# Patient Record
Sex: Male | Born: 1945 | Race: White | Hispanic: No | Marital: Single
Health system: Midwestern US, Community
[De-identification: ages and names within clinical notes are randomized; demographics above are authoritative.]

## PROBLEM LIST (undated history)

## (undated) MED ORDER — CIPROFLOXACIN 500 MG TAB
500 mg | ORAL_TABLET | Freq: Two times a day (BID) | ORAL | Status: AC
Start: ? — End: 2012-11-19

---

## 1999-12-07 ENCOUNTER — Encounter: Admission: RE | Admit: 1999-12-07 | Discharge: 1999-12-07 | Payer: Self-pay | Admitting: Family Medicine

## 1999-12-07 ENCOUNTER — Encounter: Payer: Self-pay | Admitting: Family Medicine

## 2003-10-25 ENCOUNTER — Encounter: Admission: RE | Admit: 2003-10-25 | Discharge: 2003-10-25 | Payer: Self-pay | Admitting: Family Medicine

## 2003-11-06 ENCOUNTER — Ambulatory Visit (HOSPITAL_COMMUNITY): Admission: RE | Admit: 2003-11-06 | Discharge: 2003-11-07 | Payer: Self-pay | Admitting: Neurosurgery

## 2004-01-14 ENCOUNTER — Encounter: Admission: RE | Admit: 2004-01-14 | Discharge: 2004-01-29 | Payer: Self-pay | Admitting: Neurosurgery

## 2006-09-18 ENCOUNTER — Emergency Department (HOSPITAL_COMMUNITY): Admission: EM | Admit: 2006-09-18 | Discharge: 2006-09-18 | Payer: Self-pay | Admitting: Emergency Medicine

## 2006-10-05 ENCOUNTER — Ambulatory Visit: Payer: Self-pay | Admitting: Psychiatry

## 2006-10-05 ENCOUNTER — Other Ambulatory Visit (HOSPITAL_COMMUNITY): Admission: RE | Admit: 2006-10-05 | Discharge: 2006-11-27 | Payer: Self-pay | Admitting: Psychiatry

## 2006-11-28 ENCOUNTER — Other Ambulatory Visit (HOSPITAL_COMMUNITY): Admission: RE | Admit: 2006-11-28 | Discharge: 2007-02-26 | Payer: Self-pay | Admitting: Psychiatry

## 2006-12-21 ENCOUNTER — Ambulatory Visit: Payer: Self-pay | Admitting: Psychiatry

## 2007-04-19 LAB — PSA SCREENING (SCREENING): Prostate Specific Ag: 3.2

## 2008-06-04 LAB — PSA SCREENING (SCREENING)

## 2008-07-25 LAB — PSA SCREENING (SCREENING): Prostate Specific Ag: 3.3

## 2008-10-27 IMAGING — CR DG CHEST 2V
2 series · 2 of 2 positions shown · non-contrast
Comparison: 11/05/03.

CLINICAL DATA: 60-year-old male, medical clearance.  History of alcoholism.  Anxiety and smoking.  
CHEST - 2 VIEW:

[w chest pa]
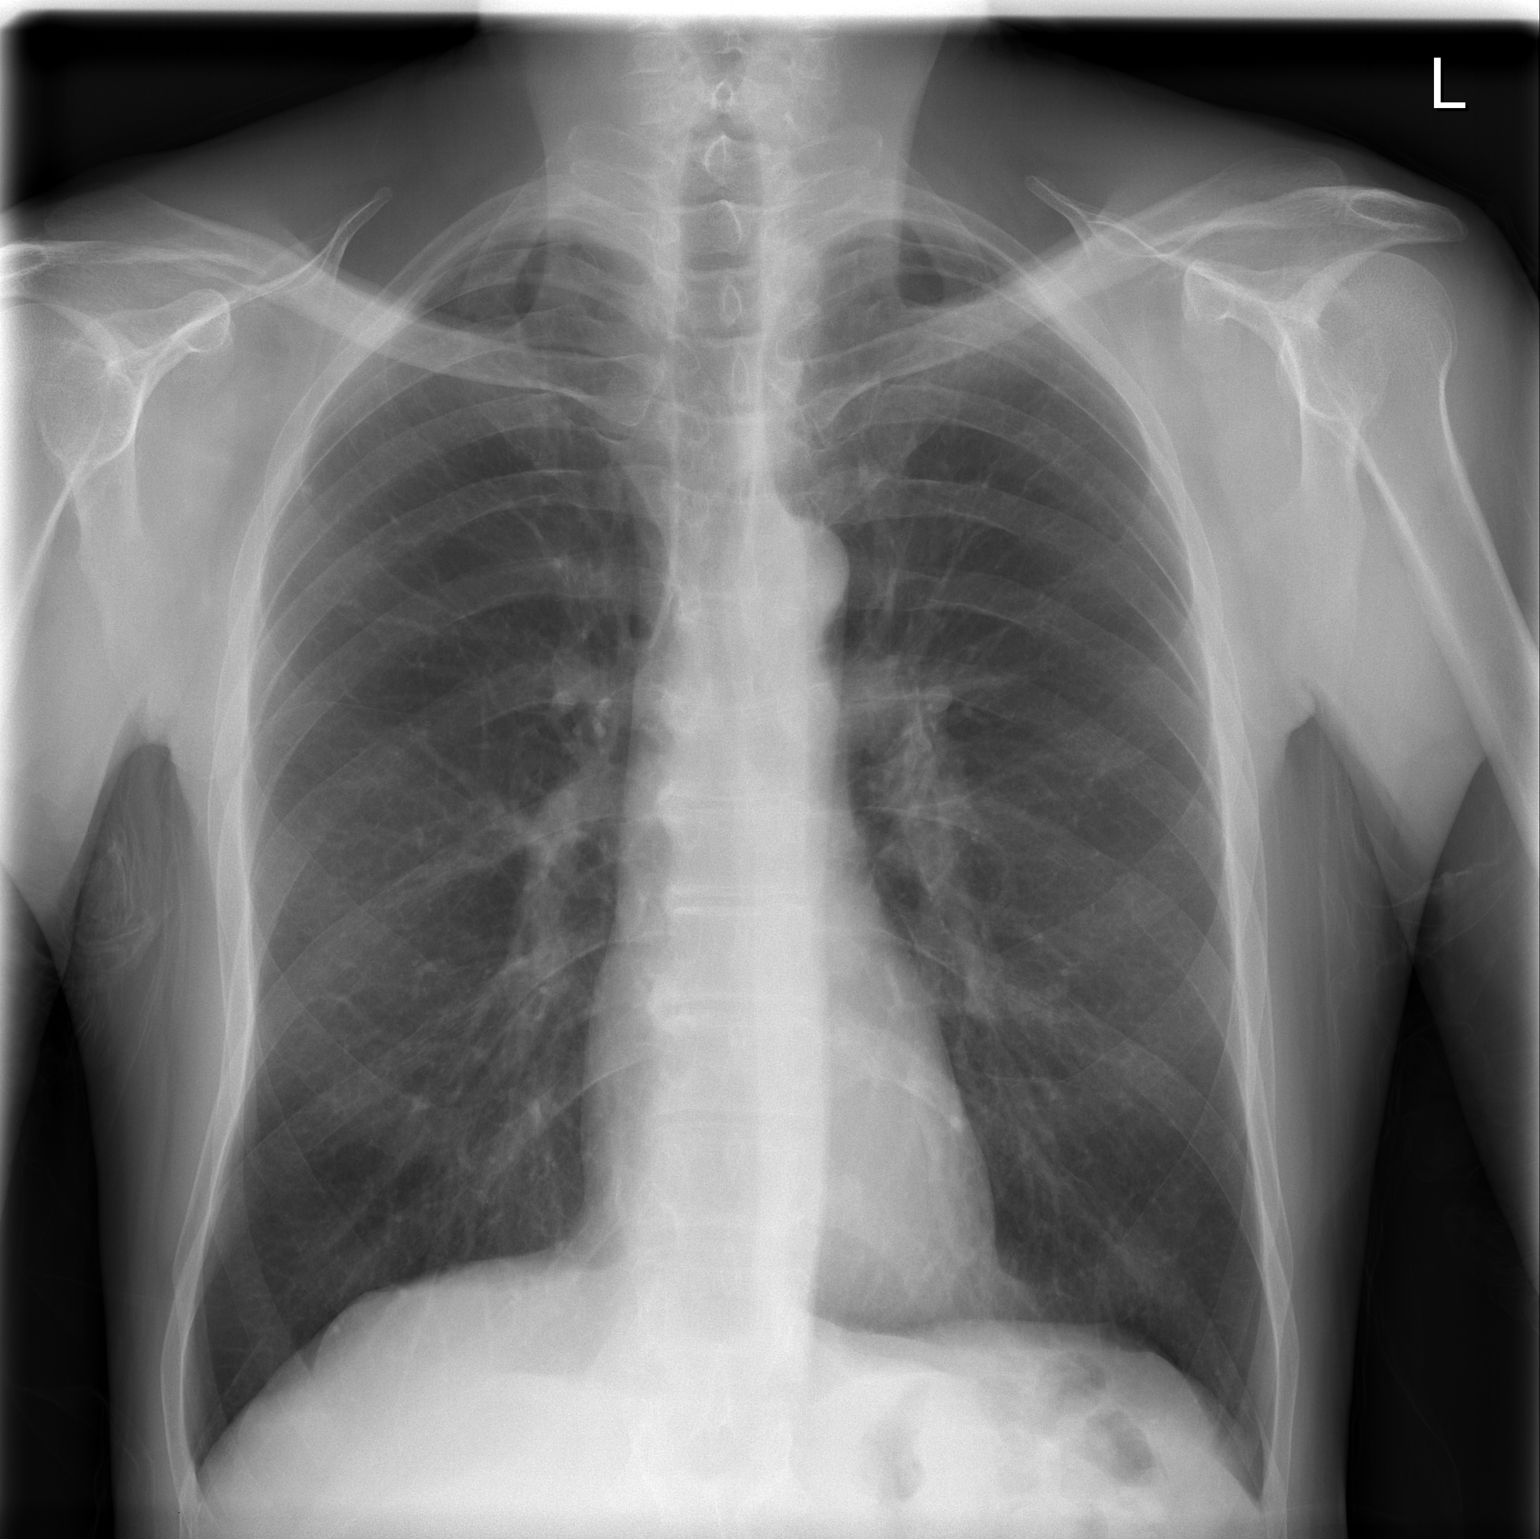

[w chest lat]
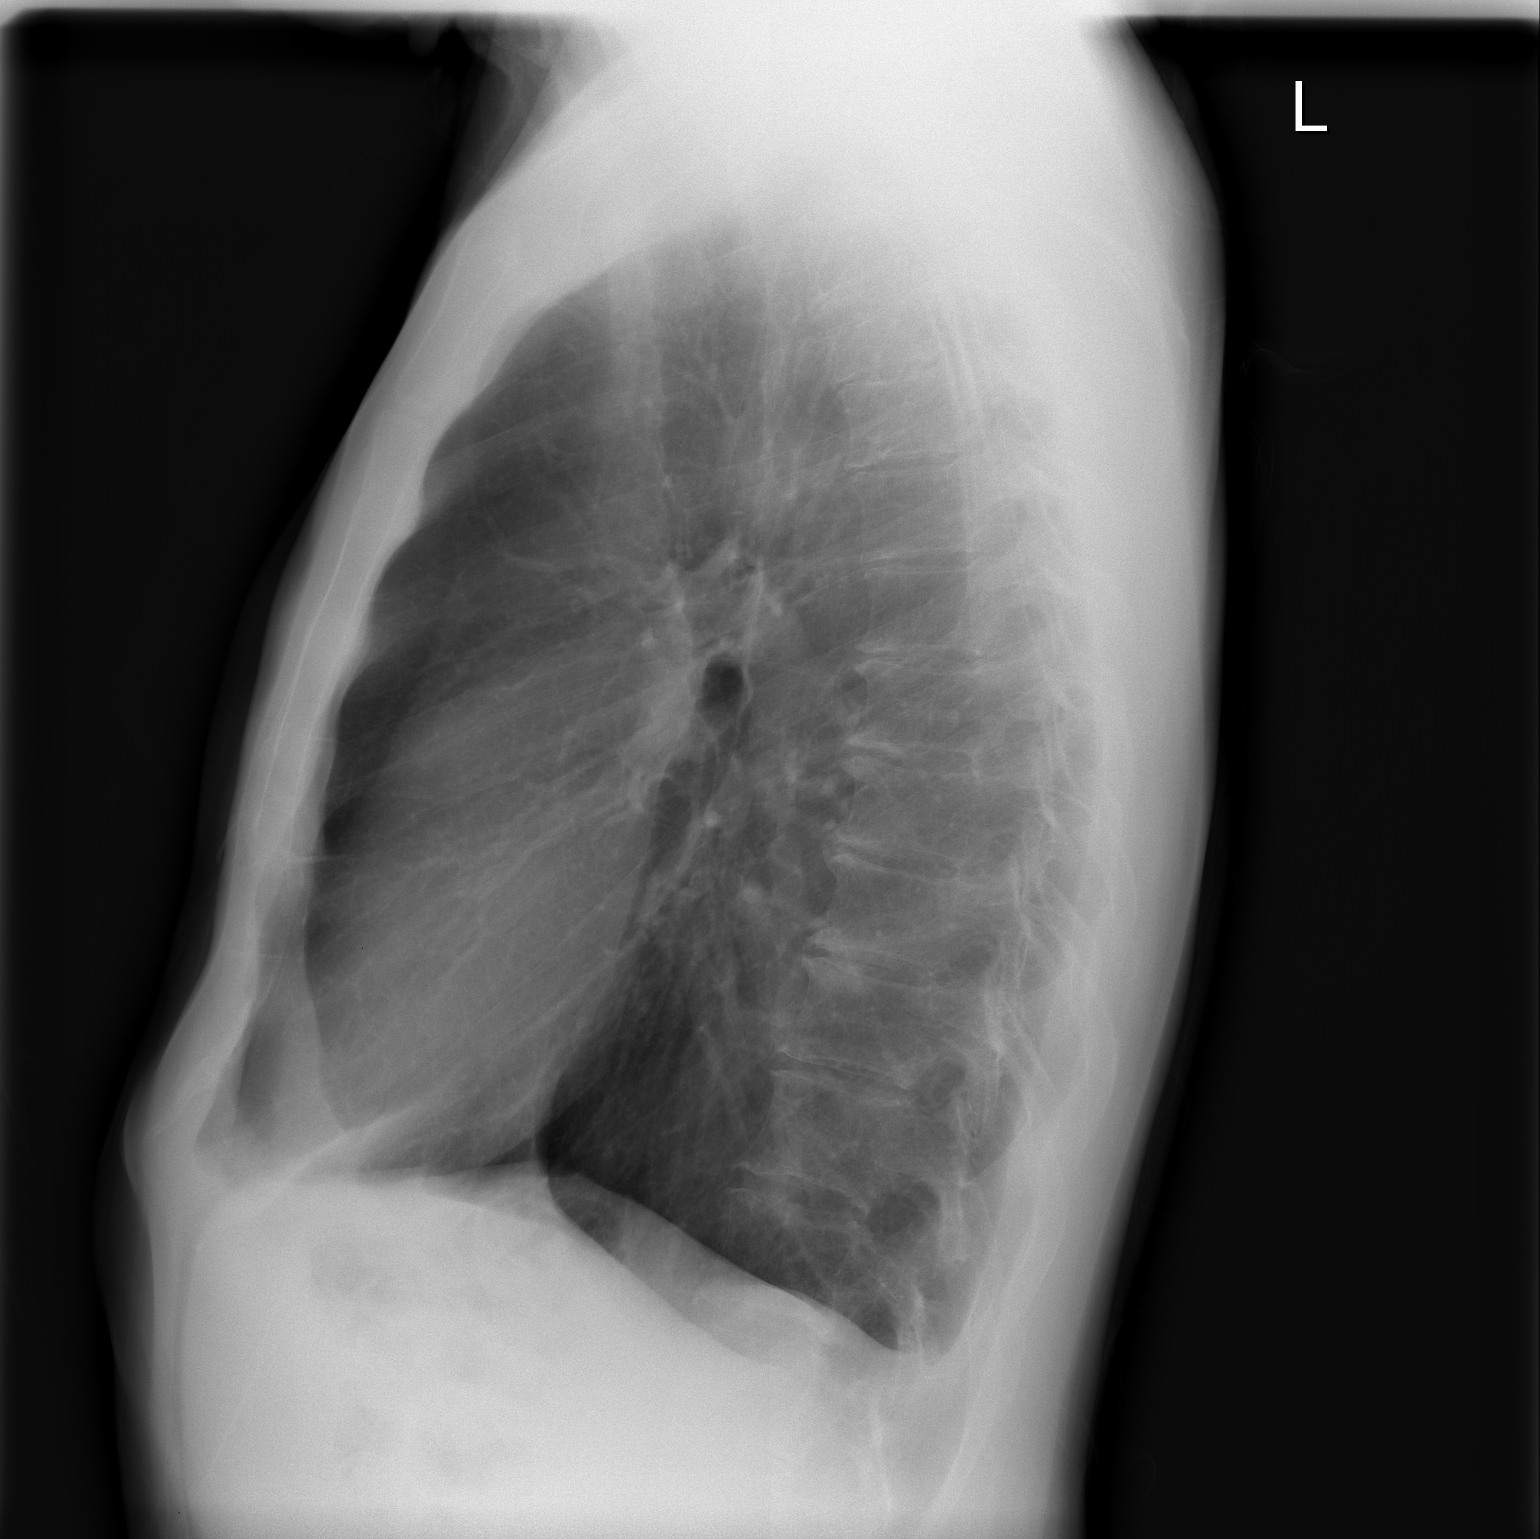

[2 of 2 positions shown; findings below may reference images not displayed]

FINDINGS: Mild hyperinflation is noted without acute air space process, edema, effusions, or pneumothorax.  Normal heart size and vascularity.  Degenerative changes of the spine.
IMPRESSION: Hyperinflation without acute air space process.  Stable exam.

## 2009-03-18 LAB — METABOLIC PANEL, COMPREHENSIVE
A-G Ratio: 0.8 (ref 0.8–1.7)
ALT (SGPT): 24 U/L — ABNORMAL LOW (ref 30–65)
AST (SGOT): 27 U/L (ref 15–37)
Albumin: 3.1 g/dL — ABNORMAL LOW (ref 3.4–5.0)
Alk. phosphatase: 76 U/L (ref 50–136)
Anion gap: 5 mmol/L (ref 5–15)
BUN/Creatinine ratio: 12 (ref 12–20)
BUN: 16 MG/DL (ref 7–18)
Bilirubin, total: 0.3 MG/DL (ref 0.1–0.9)
CO2: 29 MMOL/L (ref 21–32)
Calcium: 8.4 MG/DL (ref 8.4–10.4)
Chloride: 100 MMOL/L (ref 100–108)
Creatinine: 1.3 MG/DL (ref 0.6–1.3)
GFR est AA: >60 mL/min/{1.73_m2} (ref >60)
GFR est non-AA: 59 mL/min/{1.73_m2} — ABNORMAL LOW (ref >60)
Globulin: 3.8 g/dL (ref 2.0–4.0)
Glucose: 118 MG/DL — ABNORMAL HIGH (ref 74–99)
Potassium: 3.9 MMOL/L (ref 3.5–5.5)
Protein, total: 6.9 g/dL (ref 6.4–8.2)
Sodium: 134 MMOL/L — ABNORMAL LOW (ref 136–145)

## 2009-03-18 LAB — CKMB PROFILE
CK - MB: 1.8 ng/ml (ref 0.5–3.6)
CK - MB: 2.2 ng/ml (ref 0.5–3.6)
CK - MB: 1.3 ng/ml (ref 0.5–3.6)
CK-MB Index: 1.9 % (ref 0.0–4.0)
CK-MB Index: 1.5 % (ref 0.0–4.0)
CK-MB Index: 0.8 % (ref 0.0–4.0)
CK: 96 U/L (ref 35–232)
CK: 151 U/L (ref 35–232)
CK: 158 U/L (ref 35–232)

## 2009-03-18 LAB — CBC WITH AUTOMATED DIFF
ABS. BASOPHILS: 0 10*3/uL (ref 0.0–0.1)
ABS. EOSINOPHILS: 0 10*3/uL (ref 0.0–0.4)
ABS. LYMPHOCYTES: 0.5 10*3/uL — ABNORMAL LOW (ref 0.8–3.5)
ABS. MONOCYTES: 0.4 10*3/uL (ref 0–1.0)
ABS. NEUTROPHILS: 6.7 10*3/uL (ref 1.8–8.0)
BASOPHILS: 0 % (ref 0–3)
EOSINOPHILS: 0 % (ref 0–5)
HCT: 38.7 % (ref 37.0–49.0)
HGB: 12.7 g/dL — ABNORMAL LOW (ref 13.0–16.0)
LYMPHOCYTES: 7 % — ABNORMAL LOW (ref 20–51)
MCH: 30 PG (ref 25.0–35.0)
MCHC: 32.9 g/dL (ref 31.0–37.0)
MCV: 91.4 FL (ref 78.0–98.0)
MONOCYTES: 5 % (ref 2–9)
MPV: 8.6 FL (ref 7.4–10.4)
NEUTROPHILS: 88 % — ABNORMAL HIGH (ref 42–75)
PLATELET: 199 10*3/uL (ref 130–400)
RBC: 4.23 M/uL — ABNORMAL LOW (ref 4.50–5.30)
RDW: 18 % — ABNORMAL HIGH (ref 11.5–14.5)
WBC: 7.6 10*3/uL (ref 4.5–13.0)

## 2009-03-18 LAB — MAGNESIUM: Magnesium: 1.9 MG/DL (ref 1.8–2.4)

## 2009-03-18 LAB — NT-PRO BNP: NT pro-BNP: 3352 PG/ML — ABNORMAL HIGH (ref 0–900)

## 2009-03-18 LAB — TROPONIN I
Troponin-I, QT: <0.04 NG/ML (ref 0.00–0.08)
Troponin-I, QT: <0.04 NG/ML (ref 0.00–0.08)
Troponin-I, QT: <0.04 NG/ML (ref 0.00–0.08)

## 2009-03-18 LAB — DIGOXIN: Digoxin level: 1.38 NG/ML (ref 0.9–2.0)
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
## 2009-03-19 LAB — METABOLIC PANEL, BASIC
Anion gap: 5 mmol/L (ref 5–15)
BUN/Creatinine ratio: 12 (ref 12–20)
BUN: 15 MG/DL (ref 7–18)
CO2: 31 MMOL/L (ref 21–32)
Calcium: 8.1 MG/DL — ABNORMAL LOW (ref 8.4–10.4)
Chloride: 98 MMOL/L — ABNORMAL LOW (ref 100–108)
Creatinine: 1.3 MG/DL (ref 0.6–1.3)
GFR est AA: >60 mL/min/{1.73_m2} (ref >60)
GFR est non-AA: 59 mL/min/{1.73_m2} — ABNORMAL LOW (ref >60)
Glucose: 116 MG/DL — ABNORMAL HIGH (ref 74–99)
Potassium: 3.3 MMOL/L — ABNORMAL LOW (ref 3.5–5.5)
Sodium: 134 MMOL/L — ABNORMAL LOW (ref 136–145)

## 2009-03-19 LAB — CBC WITH AUTOMATED DIFF
ABS. BASOPHILS: 0 10*3/uL (ref 0.0–0.1)
ABS. EOSINOPHILS: 0.1 10*3/uL (ref 0.0–0.4)
ABS. LYMPHOCYTES: 0.6 10*3/uL — ABNORMAL LOW (ref 0.8–3.5)
ABS. MONOCYTES: 0.6 10*3/uL (ref 0–1.0)
ABS. NEUTROPHILS: 7.3 10*3/uL (ref 1.8–8.0)
BASOPHILS: 0 % (ref 0–3)
EOSINOPHILS: 1 % (ref 0–5)
HCT: 37.9 % (ref 37.0–49.0)
HGB: 12.6 g/dL — ABNORMAL LOW (ref 13.0–16.0)
LYMPHOCYTES: 7 % — ABNORMAL LOW (ref 20–51)
MCH: 30.6 PG (ref 25.0–35.0)
MCHC: 33.3 g/dL (ref 31.0–37.0)
MCV: 91.8 FL (ref 78.0–98.0)
MONOCYTES: 7 % (ref 2–9)
MPV: 8.5 FL (ref 7.4–10.4)
NEUTROPHILS: 85 % — ABNORMAL HIGH (ref 42–75)
PLATELET: 190 10*3/uL (ref 130–400)
RBC: 4.13 M/uL — ABNORMAL LOW (ref 4.50–5.30)
RDW: 18.9 % — ABNORMAL HIGH (ref 11.5–14.5)
WBC: 8.5 10*3/uL (ref 4.5–13.0)

## 2009-03-19 LAB — MAGNESIUM: Magnesium: 2.1 MG/DL (ref 1.8–2.4)

## 2009-03-19 NOTE — Discharge Summary (Signed)
[facility]   [address] [place], [place] [address]     TRANSFER SUMMARY    PATIENT: [name], [name]  MRN: [id number] ADMIT: 03/18/2009  BILLING: [id number] DSCH: 03/19/2009  ATTENDING: Edilia Bo, MD  DICTATING: Chauncey Reading, MD      ATTENDING PHYSICIAN: Edilia Bo, MD    PRIMARY CARE PHYSICIAN: Unknown.    CONSULTING PHYSICIANS: Harless Litten, MD, cardiology.    DISCHARGE DIAGNOSES  1. Syncope.  2. Congestive heart failure.  3. Morbid obesity with history of gastric bypass.  4. Atrial fibrillation, rate controlled.    BRIEF HISTORY: This is a 63 year old, Caucasian male with a history of an  arrhythmia, who presented to [facility] emergency department on 03/17/2009,  after having a syncopal episode at home. The patient states he had been in  normal state of health when he was standing up to get out of a van. He just  finished rolling up some carpet and thought that he was going to pass out  and then did pass out. He fell face forward and hit his face and broke a  rib. He has had numerous workups in the past for presyncope, but never a  full syncopal event. It is felt with the patient's presenting symptoms, he  should be admitted to hospital for further evaluation and treatment.    HOSPITAL COURSE: The patient was admitted to [facility] on  03/17/2009, for a syncopal event. The patient was admitted to the telemetry  floor for continuous cardiac monitoring. Cardiology consult was obtained,  and recommendations were made. With the patient's past medical history and  with recent syncopal events and numerous pre syncopal events, an AICD is  most likely warranted. The patient's cardiologist is located at [facility], therefore the patient is going to be transferred to [facility] today, 03/19/2009, for possible AICD placement.    PHYSICAL EXAMINATION AT THE TIME OF TRANSFER  VITAL SIGNS: The patient is afebrile with a temperature 97.1, blood   pressure 95/74 mmHg, pulse 71, respirations 20, saturating 93% on room  air.  NEUROLOGICALLY: He is awake, alert, oriented x3.  LUNGS: Clear to auscultation.  CARDIAC EXAM: Normal. Controlled atrial fibrillation rate of 70 to 80.  ABDOMEN: Soft, nontender, positive bowel sounds in all 4 quadrants.  Patient is morbidly obese.  EXTREMITIES: Warm, free of edema. All pulses are palpable.    DISCHARGE LABORATORY RESULTS: Revealed WBC of 8.5, platelets 190,000,  hemoglobin 12.6, hematocrit 37.9%. Sodium 135, potassium 3.3, chloride 98,  CO2 of 31, glucose 116, BUN 15, creatinine 1.1, calcium 8.1, magnesium  2.1.    TRANSFER INSTRUCTIONS  1. Activity: As tolerated.  2. Diet: Cardiac diet.  3. Lab work: CBC, CMP, magnesium levels in a.m.    FOLLOW UP: The patient should follow up with his cardiologist, Dr. Hyacinth Meeker,  upon arrival to [facility]. If you have any questions or  concerns, please do not hesitate to contact the [facility]  at [phone].    TRANSFER MEDICATIONS  1. Lipitor 40 mg p.o. at bedtime.  2. Coreg 6.25 mg p.o. daily.  3. Lasix 80 mg p.o. daily.  4. Vicodin 5/500 one tab p.o. at bedtime p.r.n. for pain.  5. Aspirin 325 mg p.o. daily.  6. Prednisone 2 mg p.o. daily.  7. The patient's digoxin, lisinopril and Trental are all on hold at this  time secondary to syncopal event and may be restarted at a later date per  cardiology recommendations.    TIME SPENT: Total transfer time is greater than 35 minutes.        Dictated By: Monica Becton, FNP             Electronically Signed   Chauncey Reading, MD 03/23/2009 12:52   Chauncey Reading, MD    MSC:WMX  D: 03/19/2009 11:43 A T:03/19/2009  12:19 P  CQDocID #: [id number] CScriptDoc #: [id number]  cc: Harless Litten, MD   Edilia Bo, MD   Chauncey Reading, MD    DR. MILLER

## 2010-06-11 LAB — PSA SCREENING (SCREENING)

## 2010-07-30 ENCOUNTER — Other Ambulatory Visit: Payer: Self-pay | Admitting: *Deleted

## 2010-10-15 NOTE — Op Note (Signed)
NAME:  Edward Contreras, LINCE NO.:  [id number]   MEDICAL RECORD NO.:  [id number]                   PATIENT TYPE:  OIB   LOCATION:  3013                                 FACILITY:  [facility]   PHYSICIAN:  Clydene Fake, M.D.               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   PROCEDURE:  Right L4-5 semihemilaminectomy and diskectomy, microdissection  with the microscope.   SURGEON:  Clydene Fake, M.D.   ASSISTANT:  Hilda Lias, M.D.   ANESTHESIA:  General endotracheal tube.   ESTIMATED BLOOD LOSS:  Minimal.   BLOOD REPLACED:  None.   DRAINS:  None.   COMPLICATIONS:  None.   INDICATIONS FOR PROCEDURE:  The patient is a 65 year old gentleman whose had  significant back pain over the last few years but in April of this year he  was moving lots of books at work and started having some severe back, right  hip and leg pain shooting down into his foot towards his big toe with some  tingling there. His leg pain has worsened, steroid Dosepak helped a little  bit but the pain started coming back at the end of that, denies any  weakness.  No left sided problems.  A MRI was done and showed right 4-5 disk  herniation.  Patient brought in for diskectomy.   DESCRIPTION OF PROCEDURE:  The patient was brought into the operating room,  general anesthesia induced, the patient was placed in prone position on a  Wilson frame with all pressure points padded. The patient was prepped and  draped in a sterile fashion, incision was injected with 10 mL of 1%  lidocaine with epinephrine. The needle was placed in the interspace, x-rays  obtained showing we were at the 4-5 interspace. An incision was then made  and centered over the 4-5 interspace.  Incision taken down into the fascia  and hemostasis was obtained with Bovie cauterization.  The fascia was  incised on the right side and subperiosteal dissection was done over the  spinous process and lamina out to the facet.  A marker was placed in the  interspace, another x-ray was obtained confirming our position at the L4-5  interspace.  A self retaining retractor was then placed and microscope was  brought in for microdissection with a high speed drill and then Kerrison  punches were used to perform a semihemilaminectomy medial facetectomy and  perform a foraminotomy over the 5 root. The ligamentum flavum was removed  decompressing the canal. The explored the epidural space down to the disk  space and a large __________ disk herniation was seen indenting into the 5  root. The disk space was  incised and diskectomy performed with pituitary  rongeurs and curettes. When we were finished, we did lateral and central  decompression.  The 4 and 5 roots were both decompressed. The wound was  irrigated with antibiotic solution, hemostasis obtained with bipolar  cauterization and Gelfoam thrombin.  The retractor was removed and the  fascia closed with #0 Vicryl interrupted sutures.  The subcutaneous tissue  closed with 3-0 Vicryl interrupted sutures and the skin closed with Benzoin  and Steri-Strips. A dressing was placed. The patient was placed back into  supine position, awakened from anesthesia and sent to the recovery room in  stable condition.                                               Clydene Fake, M.D.    JRH/MEDQ  D:  11/06/2003  T:  11/07/2003  Job:  [id number]

## 2010-11-22 DIAGNOSIS — R413 Other amnesia: Secondary | ICD-10-CM

## 2010-11-22 DIAGNOSIS — F3132 Bipolar disorder, current episode depressed, moderate: Secondary | ICD-10-CM

## 2011-03-14 LAB — URINE DRUGS OF ABUSE SCREEN W ALC, ROUTINE (REF LAB)
Benzodiazepines.: NEGATIVE
Benzodiazepines.: NEGATIVE
Ethyl Alcohol: <10
Ethyl Alcohol: <5
Marijuana Metabolite: NEGATIVE
Opiate Screen, Urine: NEGATIVE
Opiate Screen, Urine: NEGATIVE
Phencyclidine (PCP): NEGATIVE
Phencyclidine (PCP): NEGATIVE

## 2011-03-15 LAB — URINE DRUGS OF ABUSE SCREEN W ALC, ROUTINE (REF LAB)
Benzodiazepines.: NEGATIVE
Cocaine Metabolites: NEGATIVE
Ethyl Alcohol: <10
Opiate Screen, Urine: NEGATIVE
Phencyclidine (PCP): NEGATIVE

## 2011-03-16 LAB — URINE DRUGS OF ABUSE SCREEN W ALC, ROUTINE (REF LAB)
Barbiturate Quant, Ur: NEGATIVE
Benzodiazepines.: NEGATIVE
Benzodiazepines.: NEGATIVE
Cocaine Metabolites: NEGATIVE
Ethyl Alcohol: <5
Ethyl Alcohol: <5
Opiate Screen, Urine: NEGATIVE
Opiate Screen, Urine: NEGATIVE
Phencyclidine (PCP): NEGATIVE
Phencyclidine (PCP): NEGATIVE

## 2011-03-17 LAB — URINE DRUGS OF ABUSE SCREEN W ALC, ROUTINE (REF LAB)
Barbiturate Quant, Ur: NEGATIVE
Benzodiazepines.: NEGATIVE
Ethyl Alcohol: <5
Phencyclidine (PCP): NEGATIVE

## 2011-09-01 LAB — PROTHROMBIN TIME + INR

## 2011-09-02 NOTE — Telephone Encounter (Signed)
Patient was released from the hosp today, and had a jj stent put in by Dr. Winnifred Friar, he needs to come in for stent removal, where can I get him in at? Please advise.

## 2011-09-06 NOTE — ED Provider Notes (Signed)
MEDICATION ADMINISTRATION SUMMARY              Drug Name: Meropenem, Dose Ordered: 500 mg, Route: IV Med Infusion,         Status: Given, Time: 16:28 09/06/2011,          Drug Name: *Sodium Chloride 0.9%, Intravenous, Dose Ordered: 1000 mL,         Route: IV Fluid, Status: Given, Time: 11:37 09/06/2011, *Additional         information available in notes, Detailed record available in         Medication Service section.       KNOWN ALLERGIES   Keflex: Reaction: Rash, Severity: Mild, Source: Patient       Domingo Dimes Halford Decamp Sep 06, 2011 10:23 BJJ1)   PATIENT: NAME: [name], AGE: 66, GENDER: male, DOB: Wed         [birth year], TIME OF GREET: Tue Sep 06, 2011 10:15, LANGUAGE:         [place], [place]: [id number], KG WEIGHT: 147.0, HEIGHT: 182cm, MEDICAL         RECORD NUMBER: [id number], ACCOUNT NUMBER: [id number], PCP: Erline Hau,. (Tue Sep 06, 2011 10:23 BJJ1)   ADMISSION: URGENCY: 2, AMBULANCE: [facility], TRANSPORT:         Ambulance - ALS, DEPT: Emergency, BED: 2ED 36. (Tue Sep 06, 2011         10:23 BJJ1)   COMPLAINT:  Medic Weakness Hypotension. (Tue Sep 06, 2011 10:23         BJJ1)   PRESENTING COMPLAINT:  Patient was sent from his MD office after         a follow up, his BP was low and EMS was called, patient does complain         of feeling funny. (10:29 BJJ1)   PAIN: No complaint of pain. (10:29 BJJ1)   IMMUNIZATIONS:  Unknown when last tetanus shot received. (10:29         BJJ1)   TB SCREENING: TB screen negative for this patient. (10:29         BJJ1)   ABUSE SCREENING: Patient denies physical abuse or threats. (10:29         BJJ1)   FALL RISK: Patient has a high risk of falling, Patient has no         history of falling (0), Secondary diagnosis (25), None/bed rest/nurse         assist (0), IV or IV Access (20), Normal/bed rest/wheelchair (0),         Oriented to own ability (0), Total 45. (10:29 BJJ1)   SUICIDAL IDEATION: Suicidal ideation is not present. (10:29         BJJ1)    ADVANCE DIRECTIVES: Patient does not have advance directives.         (10:29 BJJ1)   PROVIDERS: TRIAGE NURSE: Lynnea Ferrier, RN. (Tue Sep 06, 2011         10:23 BJJ1)       PRESENTING PROBLEM (Tue Sep 06, 2011 10:23 BJJ1)      Presenting problems: Hypotension.       AMBULANCE (10:04 JND5)   AMBULANCE: Ambulance: Tue Sep 06, 2011 10:04.       CURRENT MEDICATIONS   Norco:  7.5/325 mg Oral As needed every 6 hours. (10:38         BJJ1)   lactobacillus 4 tabs po 3x day (10:39 BJJ1)  Prilosec:  20 Oral 2 times a day. (10:39 BJJ1)   Coreg:  3.125 Oral 2 times a day. (10:40 BJJ1)   Lisinopril:  5 Oral once a day. (10:40 BJJ1)   Lasix:  20 mg Oral 2 times a day. (10:42 BJJ1)   Synthroid:  50 mcg Oral once a day. (10:42 BJJ1)   Gabapentin:  300 mg Oral 3 times a day. (10:42 BJJ1)   Coumadin:  5 mg Oral once a day (at bedtime). (10:43 BJJ1)   Aspirin:  81 mg Oral once a day. (10:43 BJJ1)   Digoxin:  250 mcg Oral once a day. (10:44 BJJ1)   Trental:  400 mg Oral 3 times a day. (10:44 BJJ1)   Nitrostat:  0.4 mg Sublingual. (10:45 BJJ1)   Naproxen (10:45 BJJ1)       MEDICATION SERVICE   Meropenem:  Order: Meropenem - Dose: 500 mg : IV Med         Infusion         POTENTIAL ALLERGY REACTION: 'Keflex' - Not a true allergy, Physician         Discretion - Benefits outweigh the allergy risks         Ordered by: Vinnie Langton, MD         Entered by: Vinnie Langton, MD Tue Sep 06, 2011 15:14 ,          Acknowledged by: Lynnea Ferrier, RN Tue Sep 06, 2011 15:15         Documented as given by: Lynnea Ferrier, RN Tue Sep 06, 2011 16:28          Patient, Medication, Dose, Route and Time verified prior to         administration.          Amount given: 500mg , IV SITE #1 into right forearm, IV SITE #1 IVPB         or drip, initial infusion, Premixed, via pump tubing, on an IV pump,         at 100 ml/hr, Connections checked prior to administration, Line         traced prior to administration, Catheter placement confirmed via          flush prior to administration, IV site without signs or symptoms of         infiltration during medication administration, No swelling during         administration, No drainage during administration, IV flushed after         administration, Correct patient, time, route, dose and medication         confirmed prior to administration, Patient advised of actions and         side-effects prior to administration, Allergies confirmed and         medications reviewed prior to administration.    : Follow Up : _IV SITE #1:_, Medication infusion discontinued,         on Tue Sep 06, 2011 17:00, 35 minutes, . (17:00 BJJ1)   Sodium Chloride 0.9%, Intravenous:  Order: Sodium Chloride 0.9%,         Intravenous (Sodium Chloride) - Dose: 1000 mL : IV Fluid         Notes: For Hydration         Ordered by: Vinnie Langton, MD         Entered by: Vinnie Langton, MD Tue Sep 06, 2011 11:32 ,          Acknowledged by: [employer]  Laural Benes, RN Tue Sep 06, 2011 11:37         Documented as given by: Lynnea Ferrier, RN Tue Sep 06, 2011 11:37          Patient, Medication, Dose, Route and Time verified prior to         administration.          Amount given: , IV SITE #1 into right antecubital, IV SITE #1         IV fluids established, IV SITE #1 1st bag hung, amount 1 Liter, IV         SITE #1 bolus of 1000 ml established, IV SITE #1 Rate of bolus, wide         open, via primary tubing, Connections checked prior to         administration, Line traced prior to administration, Catheter         placement confirmed via flush prior to administration, IV site         without signs or symptoms of infiltration during medication         administration, No swelling during administration, No drainage during         administration, IV flushed after administration, Correct patient,         time, route, dose and medication confirmed prior to administration,         Patient advised of actions and side-effects prior to administration,          Allergies confirmed and medications reviewed prior to administration.    : Follow Up : _IV SITE #1:_, IV fluid infusion discontinued, on         Tue Sep 06, 2011 13:00, Total fluid hydration time IV site 1 1 hour,         25 minutes, ., Total amount infused: . (13:00 BJJ1)       ORDERS   BASIC METABOLIC PANEL:  Ordered for: Arvella Merles, MD, Rolm Gala         Status: Done by System Tue Sep 06, 2011 11:59. (11:06 WCW)   CHEST 2 VIEWS:  Ordered for: Arvella Merles, MD, Rolm Gala         Status: Active. (11:06 WCW)   CBC, AUTOMATED DIFFERENTIAL:  Ordered for: Arvella Merles, MD, Rolm Gala         Status: Done by System Tue Sep 06, 2011 11:45. (11:06 WCW)   12 LEAD EKG:  Ordered for: Arvella Merles, MD, Rolm Gala         Status: Active. (11:06 WCW)   Urine dip (send for lab U/A if positive):  Ordered for: Arvella Merles, MD,         Rolm Gala         Status: Done by Dolphus Jenny, ACT III, Candace Tue Sep 06, 2011 14:04.         (11:06 WCW)   O2 sat Monitor:  Ordered for: Arvella Merles, MD, Rolm Gala         Status: Done by Laural Benes RN, Gus Puma Sep 06, 2011 11:37. (11:19         WCW)   BP Monitor:  Ordered for: Arvella Merles, MD, Rolm Gala         Status: Done by Laural Benes RN, Gus Puma Sep 06, 2011 11:37. (11:19         WCW)   IV- Saline Lock:  Ordered for: Arvella Merles, MD, Rolm Gala         Status: Done by Laural Benes RN, Karen Kitchens Tue Sep 06, 2011 11:37. (11:19  WCW)   Cardiac Monitor:  Ordered for: Arvella Merles, MD, Rolm Gala         Status: Done by Laural Benes RN, Gus Puma Sep 06, 2011 11:37. (11:19         WCW)   DIGOXIN:  Ordered for: Arvella Merles, MD, Rolm Gala         Status: Done by System Tue Sep 06, 2011 13:16. (11:32 ZOX0)   TROPONIN I:  Ordered for: Arvella Merles, MD, Rolm Gala         Status: Done by System Tue Sep 06, 2011 13:16. (11:32 RUE4)   CPK PROFILE:  Ordered for: Arvella Merles, MD, Rolm Gala         Status: Done by System Tue Sep 06, 2011 13:16. (11:32 VWU9)   MYOGLOBIN (BLOOD):  Ordered for: Arvella Merles, MD, Rolm Gala         Status: Done by System Tue Sep 06, 2011 13:16. (11:32 WJX9)   HEPATIC FUNCTION PANEL:  Ordered for: Arvella Merles, MD, Rolm Gala          Status: Done by System Tue Sep 06, 2011 13:16. (11:32 Cyran.[place])   PROTHROMBIN TIME:  Ordered for: Arvella Merles, MD, Rolm Gala         Status: Done by System Tue Sep 06, 2011 13:13. (11:32 JYN8)   LACTIC ACID:  Ordered for: Arvella Merles, MD, Rolm Gala         Status: Done by System Tue Sep 06, 2011 13:05. (11:32 GNF6)   Nasal Cannula:  Ordered for: Arvella Merles, MD, Rolm Gala         Status: Active. (11:38 BJJ1)   CONTINUOUS PULSE OX:  Ordered for: Arvella Merles, MD, Rolm Gala         Status: Active. (11:38 BJJ1)   MONITOR ELECTRODE:  Ordered for: Arvella Merles, MD, Rolm Gala         Status: Active. (11:38 BJJ1)   ER OXYGEN THERAPY:  Ordered for: Arvella Merles, MD, Rolm Gala         Status: Active. (11:38 BJJ1)   IV Set - Primary Tubing:  Ordered for: Arvella Merles, MD, Rolm Gala         Status: Active. (11:38 BJJ1)   IV Start kit:  Ordered for: Arvella Merles, MD, Rolm Gala         Status: Active. (11:38 BJJ1)   BP Cuff Adult Large:  Ordered for: Arvella Merles, MD, Rolm Gala         Status: Active. (11:38 BJJ1)   Elita Boone IV Cath:  Ordered for: Arvella Merles, MD, Rolm Gala         Status: Active. (11:38 BJJ1)   URINALYSIS:  Ordered for: Arvella Merles, MD, Rolm Gala         Status: Done by System Tue Sep 06, 2011 14:28. (13:19 OZH0)   Urine sample to be obtained within 30 minutes:  Ordered for:         Arvella Merles, MD, Rolm Gala         Status: Done by Dolphus Jenny ACT III, Candace Tue Sep 06, 2011 13:55.         (13:19 EHK1)   [facility] Hospitalist for admission:  Ordered for: Arvella Merles, MD, Rolm Gala         Status: Done by Neila Gear Tue Sep 06, 2011 14:02. (14:00         Cyran.[place])   URINE DIP?:  Ordered for: Arvella Merles, MD, Rolm Gala         Status: Done by Dolphus Jenny, ACT III, Candace Tue Sep 06, 2011 14:04.         (14:01 QMV7)   BLOOD CULTURE:  Ordered for: Kisa,  MD, Rolm Gala         Status: Active. (14:07 MWN0)      Ordered for: Arvella Merles, MD, Rolm Gala         Status: Active. (14:07 Cyran.[place])   URINE CULTURE:  Ordered for: Arvella Merles, MD, Rolm Gala         Status: Active. (14:07 UVO5)   Procalcitonin:  Ordered for: Arvella Merles, MD, Rolm Gala         Status: Done by System Tue Sep 06, 2011 17:06. (14:17 [facility])    ICU for [facility] [place] Hospitalist:  Ordered for: Arvella Merles, MD, Rolm Gala         Status: Done by Neila Gear Tue Sep 06, 2011 15:30. (15:15         DGU4)   IV Pump- Single:  Ordered for: Arvella Merles, MD, Rolm Gala         Status: Active. (17:59 BJJ1)   Pump Tubing - Smart Site:  Ordered for: Arvella Merles, MD, Rolm Gala         Status: Active. (17:59 BJJ1)   Pillow Disposable:  Ordered for: Arvella Merles, MD, Rolm Gala         Status: Active. (17:59 BJJ1)   URINAL:  Ordered for: Arvella Merles, MD, Rolm Gala         Status: Active. (17:59 BJJ1)       NURSING ASSESSMENT: CV WITH PROCEDURES (10:45 BJJ1)   CONSTITUTIONAL: Patient arrives, via [facility],         Unsteady gait, Assistance to cart, Patient appears comfortable,         Patient cooperative, Patient alert, Oriented to person, place and         time, Skin warm, Skin dry, Skin normal in color, Mucous membranes         pink, Mucous membranes moist, Patient is well-groomed, Patient         complains of Patient was sent from his MD office after a follow up,         his BP was low and EMS was called, patient does complain of feeling         funny.   PAIN: Patient rates pain as 0 out of 10.   CARDIOVASCULAR: Cardiovascular assessment findings include heart         rate normal, Heart rhythm, paced, Heart sounds normal, S1, S2, Left         radial pulse +3(easily palpated, considered normal), Right radial         pulse +3(easily palpated, considered normal), Associated with         diaphoresis, currently resolved, no associated dyspnea, Associated         with dizziness, unsteady, Associated with, +3 edema to the lower         extremities, pitting, no associated palpitations, no associated         paresthesias, no associated syncopal episode, no associated weakness,         No history of pulmonary embolism, No history of DVT or leg swelling.   RESPIRATORY/CHEST: Respiratory assessment findings include         respiratory effort easy, Respirations regular, Conversing normally,          no signs of distress, Breath sounds diminished, Neck and chest exam         findings include trachea midline, Chest expansion equal, Chest         movement symmetrical, Associated with cough, non-productive, no         associated fever.   CARDIAC MONITOR: Patient  placed on cardiac monitor, Heart rate:         77, showing paced rhythm, Patient placed on non-invasive blood         pressure monitor, with disposable blood pressure cuff applied,         Patient placed on O2, Patient placed on continuous pulse oximetry,         Adult/pediatric oxisensor applied, on 2L, via nasal cannula applied.   SAFETY: Side rails up, Cart/Stretcher in lowest position, Family         at bedside, Call light within reach, Hospital ID band on.       NURSING PROCEDURE: ADMISSION (18:08 BJJ1)   ADMISSION: Patient admitted to an intermediate care unit, room         number ICU 4 room 8, Report called to, Delorise Shiner, Provided opportunity to         answer questions, Bed assigned at 1750, Report called at 1800, Acuity         level non-urgent, Transported via cart/stretcher, Accompanied by         emergency department technician, Accompanied by registered nurse,         Transported with monitor, Transported with oxygen, Transported with         disposable blood pressure cuff, Transported with IV fluids,         Transported with saline lock, Summary of Care printed.   BELONGINGS: Belongings and valuables with patient at time of         admission include:, Belongings remain with patient, Valuables remain         with patient.       NURSING PROCEDURE: IV (15:22 ENL5)   PATIENT IDENITIFIER: Patient's identity verified by patient         stating name, Patient's identity verified by patient stating birth         date, Patient's identity verified by hospital ID bracelet, Patient         actively involved in identification process.   IV SITE 2: IV therapy indicated for hydration, IV therapy          indicated for medication administration, IV established, to the left         antecubital, using a 20 gauge catheter, in one attempt, Saline lock         established, Flushed with normal saline (mls): 10ml, Labs drawn at         time of placement, labeled in the presence of the patient and sent to         lab, Labs drawn at 1520, Blood cultures drawn at time of placement,         labeled in the presence of the patient and sent to lab, Blood         cultures drawn at 1520, Tourniquet removed from patient after         procedure., Labs labeled in the presence of the patient and then sent         to lab., Blood Cultures labeled in the presence of the patient and         then sent to lab., Notes: 2nd set of blood cultures.   FOLLOW-UP SITE 2: After procedure, sterile transparent dressing         applied, After procedure, IV line connections checked and properly         labeled, After procedure, no swelling at IV site, After procedure, no  redness at IV site.   NOTES: Patient tolerated procedure well.   SAFETY: Side rails up, Cart/Stretcher in lowest position, Family         at bedside, Call light within reach, Hospital ID band on.       NURSING PROCEDURE: LAB DRAW (14:15 CAS5)   PATIENT IDENTIFIER: Patient's identity verified by patient         stating name, Patient's identity verified by patient stating birth         date, Patient's identity verified by hospital ID bracelet.   LAB DRAW: Initial lab draw performed, by venipuncture, from right         antecubital, in one attempt, Labs were drawn at 1415, Blood cultures         labeled in the presence of the patient and sent to lab, Blood         cultures were drawn at 1415, FIRST SET OF BLOOD CULTURE DRAWN FROM RT         [facility] AT 1415, Tourniquet removed from patient after procedure.   FOLLOW-UP: After procedure, dressing applied to site, After         procedure, no swelling at site, After procedure, no active bleeding         from site.    NOTES: Patient tolerated procedure well.   SAFETY: Side rails up, Cart/Stretcher in lowest position, Call         light within reach, Hospital ID band on.       DIAGNOSIS (15:15 EHK1)   FINAL: PRIMARY: Hypotension [NOS], ADDITIONAL: Acute cystitis,         PANCYTOPENIA, Weakness.       DISPOSITION   PATIENT:  Disposition Type: Inpatient, Disposition: ICU,         Disposition Transport: Stretcher, Condition: Treatment in Progress.         (15:15 EHK1)      Patient left the department. (19:01 BJJ1)       VITAL SIGNS   VITAL SIGNS: BP: 68/56 (Lying), Pulse: 75, Resp: 26, Temp: 98.7         (Oral), Pain: 0, O2 sat: 98 on Room air, Time: 09/06/2011 10:36. (10:36         BJJ1)     BP: 73/40 (Lying), Pulse: 76, Resp: 21, Time: 09/06/2011 10:45. (10:45         BJJ1)     BP: 81/48 (Sitting), Pulse: 77, Resp: 20, Pain: 0, O2 sat: 100 on 2L O2         NC, Time: 09/06/2011 13:01. (13:01 GCG1)     BP: 79/45 (Sitting), Pulse: 75, Resp: 12, Pain: 0, O2 sat: 98 on 2L O2         NC, Time: 09/06/2011 13:33. (13:33 BJJ1)     BP: 83/50 (Sitting), Pulse: 75, Resp: 25, Pain: 0, O2 sat: 100 on 2L O2         NC, Time: 09/06/2011 14:52. (14:52 BJJ1)     BP: 78/46 (Sitting), Pulse: 75, Resp: 23, Temp: 98.4 (Oral), Pain: 4, O2         sat: 99, Time: 09/06/2011 17:19. (17:19 ANM1)     BP: 78/47 (Sitting), Pulse: 77, Resp: 23, Time: 09/06/2011 17:43. (17:43         BJJ1)       PRESCRIPTION     No recorded prescriptions   Key:     ANM1=McWhorter Floyce Stakes  BJJ1=Johnson, RN, Karen Kitchens  CAS5=Small, ACT  III, Hilbert Corrigan, MD, Rolm Gala  ENL5=Laguerta, ACTIII, Excella (Samson Frederic)  GCG1=Garcia,     RN, Glenda     JND5=Duff, RN, [employer]  WCW=Warren, PA-C, [employer]

## 2011-09-06 NOTE — Procedures (Signed)
Test Reason : Other   Blood Pressure : ***/*** mmHG   Vent. Rate : 075 BPM     Atrial Rate : 089 BPM      P-R Int : 000 ms          QRS Dur : 188 ms       QT Int : 448 ms       P-R-T Axes : 000 -107 062 degrees      QTc Int : 500 ms   Ventricular pacing   When compared with ECG of 30-Jun-1997 13:26,   Ventricular pacing is now seen   Confirmed by Newman Pies, M.D., Melanee Spry ([id number]) on 09/06/2011 4:40:15 PM   Referred By:             Overread By: Zenovia Jarred, M.D.

## 2011-09-07 NOTE — Procedures (Signed)
Study ID: [id number]                                                      [facility]                                                      [address] [place], [place] [address]                                 Adult Echocardiogram Report       Name: [name], [name] Date: 09/07/2011 09:00 AM   MRN: [id number]                     Patient Location: 4NWG^N562^[id number]^M   DOB: 10-12-1945                 Age: 66 yrs   Gender: Male                    Account #: [id number]       Ordering Physician: Charleston Ropes   Performed By: Dwyane Luo       Interpretation Summary   A complete two-dimensional transthoracic echocardiogram was performed (2D, M-   mode, Doppler and color flow Doppler).   The study was technically difficult.   Limited views were obtained.   Increased left ventricular size with reduced systolic function with an   estimated ejection fraction of 30%.   Multichamber enlargement.   Mild to moderate mitral and tricuspid regurgitation.   Moderate pulmonary hypertension with a pulmonary pressure of 51 mmHg.   No previous report for comparison.   _____________________________________________________________________________   __           Left Ventricle   The left ventricle is moderately dilated. There is normal left ventricular   wall thickness. Left ventricular systolic function is moderately reduced. The   estimated left ventricular ejection fraction is 30%. There is moderate global   hypokinesis of the left ventricle.           Right Ventricle   The right ventricle is normal size. The right ventricle is mild to moderately   dilated.       Atria   The left atrium is moderately dilated. The right atrium is mildly dilated.   There is a catheter/pacemaker lead seen in the right atrium.       Mitral Valve    The mitral valve is normal in structure and function. There is no mitral valve   stenosis. There is mild to moderate mitral regurgitation.           Tricuspid Valve   The tricuspid valve is not well visualized,  but is grossly normal. There is   mild to moderate tricuspid regurgitation. Based on the peak tricuspid   regurgitation velocity the estimated right ventricular systolic pressure is 51   mmHg.       Aortic Valve   The aortic valve is normal in structure and function. No aortic stenosis. No   aortic regurgitation is present.       Pulmonic Valve   The pulmonic valve is not well seen, but is grossly normal. There is no   pulmonic valvular stenosis. There is no pulmonic valvular regurgitation.       Great Vessels   The aortic root is normal size.       Pericardium/Pleural   There is no pericardial effusion.           MMode/2D Measurements &amp; Calculations   IVSd: 1.1 cm                                    LVIDd: 6.6 cm                                                   LVPWd: 1.4 cm   Ao root diam: 3.0 cm   Ao root area: 7.1 cm2   LA dimension: 6.5 cm       Doppler Measurements &amp; Calculations   MV E max vel: 106.1 cm/sec                 MV dec time: 0.20 sec   LV IVRT: 0.08 sec   TR max vel: 337.5 cm/sec                   RAP systole: 5.0 mmHg   TR max PG: 45.6 mmHg   RVSP(TR): 50.6 mmHg           _____________________________________________________________________________   __                               Dr. Corky Crafts, MD   09/07/2011 03:33   Electronically signed byPM

## 2011-09-20 NOTE — Progress Notes (Unsigned)
Pt called and asked who would take out his pix line in his right arm per myla and demetria told me to inform pt to call his infectious disease or home health and pt stated he would call hospital back to get infectious disease provider to ask the protocol

## 2011-09-29 LAB — AMB POC URINALYSIS DIP STICK AUTO W/O MICRO
Bilirubin (UA POC): NEGATIVE
Ketones (UA POC): NEGATIVE
Nitrites (UA POC): NEGATIVE
Specific gravity (UA POC): 1.01 (ref 1.001–1.035)
Urobilinogen (UA POC): 1 (ref 0.2–1)
pH (UA POC): 7 (ref 4.6–8.0)

## 2011-09-29 NOTE — Progress Notes (Signed)
Assessment/Plan:             1. Calculus of kidney  CULTURE, URINE   2. Renal duplicate collecting system, right     3. 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  S/p laser litho 09/16/11.  Klebsiella UTI treated.  Patient unable to weight transfer onto table for cysto stent removal today.  Will plan for cysto right stents removal at [facility] with local/sedation.  Risks and benefits explained.  Repeat urine culture today.          Subjective:   [name] is a 66 y.o. Caucasian male who is seen for follow up of right renal stone.  H/o klebsiella urosepsis s/p right stent placement at [facility] and ABx therapy followed by URS/laser litho of upper calyceal matrix stone on 09/16/11 at [facility].  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  Has finished IV ABx and PIC removed.     Returning for cysto stent removal today but unable to weight transfer from wheelchair onto table for procedure.  Denies dysuria, hematuria, or voiding problems.    Past Medical History   Diagnosis Date   ??? Arthritis    ??? Lymphedema    ??? CHF (congestive heart failure)    ??? Varicose veins of lower extremities with ulcer    ??? Renal failure, acute    ??? Pulmonary hypertension    ??? Heart disease    ??? Irregular heart beat    ??? Nerve pain    ??? Arthropathy, unspecified, site unspecified    ??? Hernia    ??? Automatic implantable cardiac defibrillator in situ    ??? Cardiac arrhythmia    ??? Pacemaker    ??? Renal disorder    ??? Vision decreased    ??? Kidney stone    ??? UTI (urinary tract infection)      Past Surgical History   Procedure Date   ??? Pr appendectomy 2/06   ??? Hx fracture tx    ??? Hx hernia repair    ??? Hx appendectomy    ??? Hx cholecystectomy    ??? Hx pacemaker    ??? Hx gastric bypass    ??? Hx gastric bypass 03/15/01   ??? Hx other surgical 08-11-11     [facility],Cystoscopy, Retrograde Pyelograms, Double J Stent on right. Dr Myriam Forehand   ??? Hx other surgical  09-06-11     [facility],Cystoscopy, right retrograde pyelogram, right ureteroscopic examination of both upper and lower pole renal collecting systems, laser lithotripsy of rightupper renal calyceal matrix stone, biopsy of right upper renal matrix stone versus tissue, and right 5-French x 22 cm double-J stent times 2 stents in theupper pole moiety and in the lower pole moiety ureters.Dr. Verdie Mosher     No family history on file.  History     Social History   ??? Marital Status: SINGLE     Spouse Name: N/A     Number of Children: N/A   ??? Years of Education: N/A     Occupational History   ??? Not on file.     Social History Main Topics   ??? Smoking status: Never Smoker    ??? Smokeless tobacco: Never Used   ??? Alcohol Use: 0.5 oz/week     1 drink(s) per week   ??? Drug Use: No   ??? Sexually Active: Not  on file     Other Topics Concern   ??? Not on file     Social History Narrative   ??? No narrative on file     Current Outpatient Prescriptions   Medication Sig   ??? aspirin delayed-release 81 mg tablet Take  by mouth daily.   ??? carvedilol (COREG) 3.125 mg tablet Take  by mouth two (2) times daily (with meals).     ??? warfarin (COUMADIN) 2.5 mg tablet Take 2.5 mg by mouth daily.     ??? furosemide (LASIX) 40 mg tablet Take  by mouth daily.     ??? HYDROcodone-acetaminophen (NORCO) 5-325 mg per tablet Take  by mouth.     ??? nitroglycerin (NITROSTAT) 0.4 mg SL tablet by SubLINGual route every five (5) minutes as needed.     ??? MULTIVITAMIN/FA/ZINC ASCORBATE (SOURCECF MULTIVIT WITH ZINC PO) Take  by mouth.     ??? omeprazole (PRILOSEC) 20 mg capsule Take 20 mg by mouth daily.     ??? lisinopril (PRINIVIL) 5 mg tablet Take  by mouth daily.     ??? pentoxifylline CR (TRENTAL) 400 mg CR tablet Take 400 mg by mouth three (3) times daily (with meals).     ??? digoxin (LANOXIN) 0.25 mg tablet Take  by mouth daily.       Allergies   Allergen Reactions   ??? Cephalexin Other (comments)       Review of Systems    Constitutional: negative for fever, malaise/fatigue, weakness and  weight loss  Eyes: negative for blurred vision and double vision  Ears, nose, mouth, throat, and face: negative for congestion and headaches  Respiratory: negative for cough, shortness of breath or sputum production  Cardiovascular: negative for chest pain and leg swelling  Gastrointestinal: negative for abdominal pain, diarrhea, nausea and vomiting  Skin: negative for itching and rash  Hematologic/lymphatic: negative for easy bruising/bleeding  Musculoskeletal:negative for joint pain and myalgias  Neurological: negative for dizziness, focal weakness and LOC      Physical:     Estimated Body mass index is 43.94 kg/(m^2) as calculated from the following:    Height as of this encounter: 6\' 0" (1.829 m).    Weight as of this encounter: 324 lb(146.965 kg).   BP 104/64   Ht 6' (1.829 m)   Wt 324 lb (146.965 kg)   BMI 43.94 kg/m2  General appearance: alert, cooperative, no distress, appears stated age  Head: Normocephalic, without obvious abnormality, atraumatic  Neck: supple, symmetrical, trachea midline and no adenopathy  Back: symmetric, no curvature. ROM normal. No CVA tenderness.  Abdomen: obese, soft, non-tender.   No masses,  no organomegaly  Extremities: extremities normal, atraumatic, no cyanosis or edema  Skin: Skin color, texture, turgor normal. No rashes or lesions  Neurologic: Grossly normal    LABS:  Results for orders placed in visit on 09/29/11   AMB POC URINALYSIS DIP STICK AUTO W/O MICRO       Component Value Range    Color Yellow  (none)    Clarity Cloudy  (none)    Glucose 1+  (none)    Bilirubin Negative  (none)    Ketones Negative  (none)    Spec.Grav. 1.010  1.001 - 1.035    Blood 4+  (none)    pH 7.0  4.6 - 8.0    Protein, Urine 1+  Negative mg/dL    Urobilinogen 1 mg/dL  0.2 - 1    Nitrites Negative  (none)  Leukocyte esterase 2+  (none)     09/19/2011 14:24   FINAL DIAGNOSIS  MATERIAL DESIGNATED RIGHT RENAL BIOPSY:   DEGENERATED FIBRINOUS MATERIAL WITH DYSTROPHIC CALCIFICATIONS       IMAGING:   CT Abdomen/Pelvis without Contrast  [id number] 08/25/2011  FEVER AND ABDOMINAL PAIN     History: Fever, abdominal pain, renal stones    Comparison 08/11/11    Routine noncontrast helical CT imaging was performed of the  abdomen and pelvis without contrast using urinary stone protocol.  Coronal and sagittal reconstructions obtained.    FINDINGS:    Abdomen CT: There has been interval development very small  bilateral pleural effusions with minimal streaky density  visualized inferior middle lobe and stable tiny subpleural nodule  posterior inferior lingula. Mild bibasilar atelectasis. Diffuse  mild enlargement of the heart. Soft tissue density right  anterior chest wall which correlates to prior study is compatible  with small amount of right-sided breast tissue, gynecomastia.   Pacemaker wires again noted with postsurgical changes proximal  stomach and previous cholecystectomy.    There is bifid right-sided renal pelvis. There has been interval  placement of right-sided ureteral stent with the proximal tip  coiled within the lower pole right renal pelvis. Previously  noted calcification either representing single calculus or  collection of several adjacent smaller calculi is again seen  within the left upper pole renal pelvis with residual moderately  dilated proximal left upper pole infundibulum and associated  calyx. There has been resolution previous air within the right  collecting system. There is persistent 10 mm diameter  nonobstructing posterior right upper pole calculus. 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 There is no new hypodensity  within the right renal parenchyma. There is mild right sided  perirenal stranding. Stable 3 cm likely benign cyst upper pole  left kidney internal density 12 Hounsfield units with no evidence  of left-sided obstruction or calculus.    Again noted are 2 separate upper and lower midline abdominal wall  hernias, and more cephalad hernia contains portion of  transverse  colon and the more inferior hernia as probably small bowel loops,  but neither hernia shows wall thickening or proximal dilatation.   Noncontrast imaging liver spleen pancreas and adrenals  unremarkable. Stable several mildly enlarged retroperitoneal  lymph nodes the largest 16 mm between IVC and aorta and 2.2 cm  left para-aortic. IVC filter remains in stable position. No new  bowel dilatation or wall thickening. No evidence of ascites.   Continued multilevel degenerative lumbar spondylosis with no new  acute osseous finding. Multiple likely injection granulomas are  seen within the anterior abdominal wall. Mild diffuse anasarca.    Pelvic CT: Bladder is nondistended with right-sided ureteral  stent coiled within the bladder lumen. No evidence of calculus  within the bladder. Mild anasarca and nonspecific strandy soft  tissue changes in the fat surrounding bladder. Severe DJD right  hip.  IMPRESSION  Impression:    1. Duplicated appearing upper right renal collecting system,  with proximal aspect of right-sided ureteral stent within the  lower pole renal pelvis. Persistent 17 mm diameter either single  calculus or several adjacent smaller calculi within the left  upper pole renal pelvis with moderate proximal collecting system  dilatation. Interval resolution air within collecting system.   There is persistent 1 cm nonobstructing calculus posterior upper  pole right kidney.    2. Interval development very small bilateral pleural effusions  with mild bibasilar atelectasis and small  atelectasis or scarring  inferior middle lobe.    3. Mild diffuse anasarca.    4. Stable appearance of 2 separate upper and lower midline  abdominal wall hernias with no evidence of bowel obstruction or  wall thickening to suggest strangulation.    5. No other significant interval change.          This note has been sent to the referring physician, with findings and recommendations.      Page Spiro, MD, MD, FACS          CC:   Erline Hau  [address]  [place] [address]

## 2011-09-29 NOTE — Progress Notes (Deleted)
Cystoscopy with Stent Removal     Patient: Quandarius Nill  Date: 09/29/2011        History / Imaging       Urinalysis   Results for orders placed in visit on 09/29/11   AMB POC URINALYSIS DIP STICK AUTO W/O MICRO       Component Value Range    Color Yellow  (none)    Clarity Cloudy  (none)    Glucose 1+  (none)    Bilirubin Negative  (none)    Ketones Negative  (none)    Spec.Grav. 1.010  1.001 - 1.035    Blood 4+  (none)    pH 7.0  4.6 - 8.0    Protein, Urine 1+  Negative mg/dL    Urobilinogen 1 mg/dL  0.2 - 1    Nitrites Negative  (none)    Leukocyte esterase 2+  (none)   09/19/2011 14:24   FINAL DIAGNOSIS  MATERIAL DESIGNATED RIGHT RENAL BIOPSY:   DEGENERATED FIBRINOUS MATERIAL WITH DYSTROPHIC CALCIFICATIONS.   [address]            Exam   Scope {Scope:11165}   Lidocaine Jelly {yes/ no default yes:19425::"yes"}   Meatus {DESC; UEAVWU:98119}   Urethra {DESC; JYNWGNF:62130}   Prostate {DESC; PROSTATE:19376}   Bladder Neck {DESC; BLADDER QMVH:84696}   Trigone {DESC; EXBMWUX:32440}   Trabeculation {BLADDER MUCOSA:17931}   Diverticuli {yes/ no default no:19426::"no"}   Lesion {BSHSI GU BLADDER TUMOR:30010474}   Comment: {None:33079::"none"}     Impression: ***   Plan: ***     Antibiotic/Meds No orders of the defined types were placed in this encounter.         Page Spiro, MD, FACS

## 2011-09-30 NOTE — Telephone Encounter (Signed)
Spoke to pt regarding his surgery date and time at [facility] on 10-04-11

## 2011-10-03 NOTE — Progress Notes (Signed)
Quick Note:    Contaminants, No need to treat.     ______

## 2012-01-29 DEATH — deceased

## 2012-11-12 LAB — AMB POC URINALYSIS DIP STICK AUTO W/O MICRO
Bilirubin (UA POC): NEGATIVE
Glucose (UA POC): NEGATIVE
Ketones (UA POC): NEGATIVE
Nitrites (UA POC): NEGATIVE
Protein (UA POC): NEGATIVE mg/dL
Specific gravity (UA POC): 1.015 (ref 1.001–1.035)
Urobilinogen (UA POC): 2 (ref 0.2–1)
pH (UA POC): 6.5 (ref 4.6–8.0)

## 2012-11-12 NOTE — Progress Notes (Signed)
Assessment/Plan:               ICD-9-CM    1. 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 Phimosis 605    3. Urine leukocytes 791.7 AMB POC URINALYSIS DIP STICK AUTO W/O MICRO     CULTURE, URINE   4. Microhematuria 599.72    5. 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  Will need elective circumcision.  Anesthetic risk due to multiple comorbidities.  Can do procedure wit dorsal penile block and IV sedation.  All risks and benefits explained.  Will get preop medical/cardiac clearance.  Hold coumadin x 4 days prior to surgery.    Right renal duplication with upper calyceal ? Matrix stone.  h/o Klebsiella UTI treated.  S/p laser litho 09/16/11.              Subjective:   [name] is a 67 y.o. Caucasian male who is seen for follow up of foreskin irritation and dysuria.    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  H/o klebsiella urosepsis s/p right stent placement at [facility] and ABx therapy followed by URS/laser litho of upper calyceal matrix stone on 09/16/11 at [facility].  Returning for cysto stent removal in office but unable to weight transfer from wheelchair onto table for procedure.  Stent removal at [facility] 09/2011.     Uncircumcised with worsening tightened foreskin, enlargment of condyloma at tip of foreskin, c/o bothersome dysuria with splaying of stream.    Past Medical History   Diagnosis Date   ??? Arthritis    ??? Lymphedema    ??? CHF (congestive heart failure)    ??? Varicose veins of lower extremities with ulcer    ??? Renal failure, acute    ??? Pulmonary hypertension    ??? Heart disease    ??? Irregular heart beat    ??? Nerve pain    ??? Arthropathy, unspecified, site unspecified    ??? Hernia    ??? Automatic implantable cardiac defibrillator in situ    ??? Cardiac arrhythmia    ??? Pacemaker    ??? Renal disorder    ??? Vision decreased    ??? Kidney stone    ??? UTI (urinary tract infection)      Past Surgical History   Procedure Laterality Date   ??? Pr appendectomy  2/06    ??? Hx fracture tx     ??? Hx hernia repair     ??? Hx appendectomy     ??? Hx cholecystectomy     ??? Hx pacemaker     ??? Hx gastric bypass     ??? Hx gastric bypass  03/15/01   ??? Hx other surgical  08-11-11     SH,Cystoscopy, Retrograde Pyelograms, Double J Stent on right. Dr Myriam Forehand   ??? Hx other surgical  09-06-11     [facility],Cystoscopy, right retrograde pyelogram, right ureteroscopic examination of both upper and lower pole renal collecting systems, laser lithotripsy of rightupper renal calyceal matrix stone, biopsy of right upper renal matrix stone versus tissue, and right 5-French x 22 cm double-J stent times 2 stents in theupper pole moiety and in the lower pole moiety ureters.Dr. Verdie Mosher     History reviewed. No pertinent family history.  History     Social History   ??? Marital Status: SINGLE     Spouse Name: N/A     Number of Children:  N/A   ??? Years of Education: N/A     Occupational History   ??? Not on file.     Social History Main Topics   ??? Smoking status: Never Smoker    ??? Smokeless tobacco: Never Used   ??? Alcohol Use: 0.5 oz/week     1 drink(s) per week   ??? Drug Use: No   ??? Sexually Active: Not on file     Other Topics Concern   ??? Not on file     Social History Narrative   ??? No narrative on file     Current Outpatient Prescriptions   Medication Sig   ??? carvedilol (COREG) 6.25 mg tablet Take  by mouth two (2) times daily (with meals).   ??? gabapentin (NEURONTIN) 300 mg capsule Take 300 mg by mouth two (2) times a day.   ??? aspirin delayed-release 81 mg tablet Take  by mouth daily.   ??? carvedilol (COREG) 3.125 mg tablet Take  by mouth every morning.   ??? warfarin (COUMADIN) 2.5 mg tablet Take 2.5 mg by mouth daily.     ??? furosemide (LASIX) 40 mg tablet Take  by mouth daily.     ??? HYDROcodone-acetaminophen (NORCO) 5-325 mg per tablet Take  by mouth.     ??? nitroglycerin (NITROSTAT) 0.4 mg SL tablet by SubLINGual route every five (5) minutes as needed.     ??? MULTIVITAMIN/FA/ZINC ASCORBATE (SOURCECF MULTIVIT WITH ZINC PO) Take  by  mouth.     ??? omeprazole (PRILOSEC) 20 mg capsule Take 20 mg by mouth daily.     ??? lisinopril (PRINIVIL) 5 mg tablet Take  by mouth nightly.   ??? pentoxifylline CR (TRENTAL) 400 mg CR tablet Take 400 mg by mouth three (3) times daily (with meals).     ??? digoxin (LANOXIN) 0.25 mg tablet Take  by mouth daily.       No current facility-administered medications for this visit.     Allergies   Allergen Reactions   ??? Cephalexin Other (comments)     Review of Systems  Constitutional: Fever: No  Skin: Rash: No  HEENT: Hearing difficulty: No  Eyes: Blurred vision: No  Cardiovascular: Chest pain: No  Respiratory: Shortness of breath: No  Gastrointestinal: Nausea/vomiting: No  Musculoskeletal: Back pain: No  Neurological: Weakness: Yes  Psychological: Memory loss: Yes  Comments/additional findings:       Physical:     Estimated body mass index is 43.93 kg/(m^2) as calculated from the following:    Height as of this encounter: 6' (1.829 m).    Weight as of this encounter: 324 lb (146.965 kg).   BP 100/60   Ht 6' (1.829 m)   Wt 324 lb (146.965 kg)   BMI 43.93 kg/m2  General appearance: alert, cooperative, no distress, appears stated age  Head: Normocephalic, without obvious abnormality, atraumatic  Neck: supple, symmetrical, trachea midline and no adenopathy  Chest:  Normal respiratory effort and excursions.     Back: symmetric, no curvature. ROM normal.   Abdomen: obese, soft, non-tender.   No masses,  no organomegaly  GU:  Tight phimotic foreskin with collection of rough condylomas protruding out from tip of prepuce  Extremities: extremities normal, atraumatic, no cyanosis or edema  Skin: Skin color, texture, turgor normal. No rashes or lesions  Neurologic: Grossly normal    LABS:  Results for orders placed in visit on 11/12/12   AMB POC URINALYSIS DIP STICK AUTO W/O MICRO  Result Value Range    Color (UA POC) Yellow      Clarity (UA POC) Cloudy      Glucose (UA POC) Negative  Negative    Bilirubin (UA POC) Negative   Negative    Ketones (UA POC) Negative  Negative    Specific gravity (UA POC) 1.015  1.001 - 1.035    Blood (UA POC) Trace  Negative    pH (UA POC) 6.5  4.6 - 8.0    Protein (UA POC) Negative  Negative mg/dL    Urobilinogen (UA POC) 2 mg/dL  0.2 - 1    Nitrites (UA POC) Negative  Negative    Leukocyte esterase (UA POC) 2+  Negative     09/19/2011 14:24   FINAL DIAGNOSIS  MATERIAL DESIGNATED RIGHT RENAL BIOPSY:   DEGENERATED FIBRINOUS MATERIAL WITH DYSTROPHIC CALCIFICATIONS       IMAGING:  CT Abdomen/Pelvis without Contrast  [id number] 08/25/2011  FEVER AND ABDOMINAL PAIN     History: Fever, abdominal pain, renal stones    Comparison 08/11/11    Routine noncontrast helical CT imaging was performed of the  abdomen and pelvis without contrast using urinary stone protocol.  Coronal and sagittal reconstructions obtained.    FINDINGS:    Abdomen CT: There has been interval development very small  bilateral pleural effusions with minimal streaky density  visualized inferior middle lobe and stable tiny subpleural nodule  posterior inferior lingula. Mild bibasilar atelectasis. Diffuse  mild enlargement of the heart. Soft tissue density right  anterior chest wall which correlates to prior study is compatible  with small amount of right-sided breast tissue, gynecomastia.   Pacemaker wires again noted with postsurgical changes proximal  stomach and previous cholecystectomy.    There is bifid right-sided renal pelvis. There has been interval  placement of right-sided ureteral stent with the proximal tip  coiled within the lower pole right renal pelvis. Previously  noted calcification either representing single calculus or  collection of several adjacent smaller calculi is again seen  within the left upper pole renal pelvis with residual moderately  dilated proximal left upper pole infundibulum and associated  calyx. There has been resolution previous air within the right  collecting system. There is persistent 10 mm diameter   nonobstructing posterior right upper pole calculus. 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 There is no new hypodensity  within the right renal parenchyma. There is mild right sided  perirenal stranding. Stable 3 cm likely benign cyst upper pole  left kidney internal density 12 Hounsfield units with no evidence  of left-sided obstruction or calculus.    Again noted are 2 separate upper and lower midline abdominal wall  hernias, and more cephalad hernia contains portion of transverse  colon and the more inferior hernia as probably small bowel loops,  but neither hernia shows wall thickening or proximal dilatation.   Noncontrast imaging liver spleen pancreas and adrenals  unremarkable. Stable several mildly enlarged retroperitoneal  lymph nodes the largest 16 mm between IVC and aorta and 2.2 cm  left para-aortic. IVC filter remains in stable position. No new  bowel dilatation or wall thickening. No evidence of ascites.   Continued multilevel degenerative lumbar spondylosis with no new  acute osseous finding. Multiple likely injection granulomas are  seen within the anterior abdominal wall. Mild diffuse anasarca.    Pelvic CT: Bladder is nondistended with right-sided ureteral  stent coiled within the bladder lumen. No evidence of calculus  within the bladder.  Mild anasarca and nonspecific strandy soft  tissue changes in the fat surrounding bladder. Severe DJD right  hip.  IMPRESSION  Impression:    1. Duplicated appearing upper right renal collecting system,  with proximal aspect of right-sided ureteral stent within the  lower pole renal pelvis. Persistent 17 mm diameter either single  calculus or several adjacent smaller calculi within the left  upper pole renal pelvis with moderate proximal collecting system  dilatation. Interval resolution air within collecting system.   There is persistent 1 cm nonobstructing calculus posterior upper  pole right kidney.    2. Interval development very small bilateral  pleural effusions  with mild bibasilar atelectasis and small atelectasis or scarring  inferior middle lobe.    3. Mild diffuse anasarca.    4. Stable appearance of 2 separate upper and lower midline  abdominal wall hernias with no evidence of bowel obstruction or  wall thickening to suggest strangulation.    5. No other significant interval change.          This note has been sent to the referring physician, with findings and recommendations.      Page Spiro, MD, MD, FACS          CC:  Erline Hau  [address]  [place] [address]

## 2012-11-12 NOTE — Progress Notes (Signed)
AUA Symptom Score Index Questionnaire Results  1. Incomplete Emptying        0  2. Frequency                         0  3. Intermittency                     0  4. Urgency                            0    5. Weak Stream                   0  6. Straining                            0  7. Nocturia                            3    Total Score                          3      Quality of Life                         4

## 2012-11-14 NOTE — Telephone Encounter (Signed)
UTI, treat with cipro 500mg  bid x 5 days.  Rx faxed to pharmacy of choice, please notify patient to pick up and take as directed.  Will need to monitor INR more closely and adjust coumadin dose accordingly, cipro can cause INR to increase.

## 2012-11-15 NOTE — Telephone Encounter (Signed)
Called pt and advised him to pick up his Rx and take as directed. I also advised pt that he should get his INR checked since cipro can increase the level. Pt stated he will go to his cardiologist for monitoring.

## 2012-11-27 NOTE — Progress Notes (Signed)
I was giving pt a phone call to get pt scheduled for his procedure. Per pt he is working on getting a Social research officer, government card. Pt will give the office a phone call back to schedule his procedure

## 2012-12-14 NOTE — Telephone Encounter (Signed)
Error

## 2017-02-22 ENCOUNTER — Inpatient Hospital Stay
Admit: 2017-02-22 | Discharge: 2017-03-01 | Disposition: A | Discharge status: Skilled Nursing Facility | Payer: MEDICARE | Attending: Internal Medicine | Admitting: Internal Medicine

## 2017-02-22 ENCOUNTER — Emergency Department: Admit: 2017-02-23 | Payer: MEDICARE | Primary: Family Medicine

## 2017-02-22 DIAGNOSIS — I110 Hypertensive heart disease with heart failure: Secondary | ICD-10-CM

## 2017-02-22 LAB — METABOLIC PANEL, BASIC
Anion gap: 5 mmol/L (ref 5–15)
BUN: 16 mg/dl (ref 7–25)
CO2: 37 mEq/L — ABNORMAL HIGH (ref 21–32)
Calcium: 8.6 mg/dl (ref 8.5–10.1)
Chloride: 96 mEq/L — ABNORMAL LOW (ref 98–107)
Creatinine: 0.9 mg/dl (ref 0.6–1.3)
GFR est AA: >60
GFR est non-AA: >60
Glucose: 120 mg/dl — ABNORMAL HIGH (ref 74–106)
Potassium: 3.5 mEq/L (ref 3.5–5.1)
Sodium: 138 mEq/L (ref 136–145)

## 2017-02-22 MED ORDER — SODIUM CHLORIDE 0.9% BOLUS IV
0.9 % | Freq: Once | INTRAVENOUS | Status: AC
Start: 2017-02-22 — End: 2017-02-22
  Administered 2017-02-22: 21:00:00 via INTRAVENOUS

## 2017-02-22 MED ORDER — MIDODRINE 5 MG TAB
5 mg | ORAL | Status: AC
Start: 2017-02-22 — End: 2017-02-22
  Administered 2017-02-22: 21:00:00 via ORAL

## 2017-02-22 MED FILL — MIDODRINE 5 MG TAB: 5 mg | ORAL | Qty: 2

## 2017-02-22 NOTE — Progress Notes (Signed)
[facility] Dosing Services:     Consult for Warfarin Dosing by Pharmacy by Dr. Jerl Mina  Consult provided for this 71 y.o.  male , for indication of Thromboembolism (Prevent with Chronic Atrial Fibrillation) Comment: 2-3  Dose to achieve an INR goal of 2-3    Order entered for  Warfarin  4 (mg) ordered to be given today at 18:00.     Significant drug interactions: Plavix, ASA (on at home), omeprazole (increased bleeding risk - minor), pentoxifylline ( increased bleeding risk, on as outpatient, monitor)  Previous dose given Warfarin 3 mg po on Mondays, Wednesdays and Fridays. Warfarin 4 mg po Tuesdays, Thursday, Saturdays and Sundays   PT/INR Lab Results   Component Value Date/Time    INR 2.8 (H) 02/22/2017 08:20 PM      Platelets Lab Results   Component Value Date/Time    PLATELET 185 02/22/2017 08:20 PM      H/H Lab Results   Component Value Date/Time    HGB 13.0 02/22/2017 08:20 PM        Pharmacy to follow daily and will provide subsequent Warfarin dosing based on clinical status.  Tressie Stalker, [facility])  Contact information [phone]

## 2017-02-22 NOTE — Progress Notes (Signed)
Patient refused ABG test notified ordering physician, test D/C

## 2017-02-22 NOTE — Consults (Addendum)
Cardiology Consult Note   - pt seen in office today as noted below by Secundino Ginger, NP  Has been hypotensive despite holding some meds at rehab and with midodrine. Last hospitalization in 12/2016, pt was taken off lisinopril and started on combo of coreg, Entresto and midodrine for EF 22% and failure to improve despite cardiac resynchronization therapy.   - Will see pt in ED and follow along. Likely will need to stop entresto all together, and possibly coreg. Pt has had longstanding issue with hypotension, but per previous hospitalization notes- if pt symptomatic, are attempting to tolerate SBP in 80s-90s and still give usual meds.     Office visit from 02/22/17    REASON FOR OFFICE VISIT/Chief Complaint:  management of dilated cardiomyopathy  VITALS: BP:  60/40   HR:  105   Ht:  73 in   Wt:  315 lbs    BMI:  41.6 kg/m2   - 02/22/2017 2:03:00 PM        HISTORY OF PRESENT ILLNESS:     I had the pleasure of seeing [name] in the office today for cardiovascular follow-up for management of dilated cardiomyopathy.  He has a history of morbid obesity, cardiomyopathy s/p BiV ICD, chronic systolic heart failure, ventricular tachycardia and permanent atrial fibrillation.  He is dehydrated and hypotension.  He complains of bilateral lower extremity weakness that started several weeks ago.  He has been in [place] since hospital discharge last month.  He complains of dizziness and lightheadedness.  He states he has been unable to take his prescribed medications due to hypotension and taken midodrine to improve his blood pressure.  He is currently on Entrest, Carvedilol Lasix 30 mg in divided doses, midodrine, and pain medication for his chronic pain.  He comes by medical transport from [facility].  His wife states the providers at rehab considered IV fluid infusion on Friday; however, he hasn???t had any IV fluids as of today.  His heart rates are  tachy and he is SOB.  He denies any chest pain, orthopnea, PND, lower extremity edema, palpitations, feelings of presyncope, or claudication.     PROBLEM LIST:  1. Dehydration      PAST MEDICAL HISTORY:  1. Non-obstructive CAD  a. NST is negative for ischemia with mildly dilated LV cavity, mod. global hypokinesis with EF 30% - 08/03/2016  2. Dilated Cardiomyopathy  a. Echo EF 30-35% with dilated LV and mild LVH, reduced LV systolic function with global hypokinesis, severely dilated LA, no valvular pathology and dilated aortic root (4 cm) ??? 07/27/2016  b. Echo EF 22% with severely decreased LV function, grade III diastolic dysfunction, mod-severe basal to mid anterolateral and inferolateral wall segments, severely dilated LA, dilated RH with normal systolic function, severe MR, mild Mitral annular calcification, mod TR and RVSP 56 mmHG ??? 01/12/17  3. Cardiomyopathy with moderate-to-severe left ventricular systolic dysfunction.  4. [place] Heart Association functional class 2 symptoms at baseline with chronic systolic heart failure.  a. s/p St. JudeBi-V ICD implant by [place] ??? 03/23/2009  5. Low Bi-V and ventricular pacing percentage due to A-fib.  6. Non-obstructive coronary artery disease  a. Cardiac catheterization with mild non-obstructive CAD with mild luminal irregularities, severely dilated LV with severe LV dysfunction, LVEF 20% with global hypokinesis, severe pulmonary hypertension by Alimard ??? 03/20/2009  b. Cardiac catheterization with reduced EF without significant valve disease and mild CAD appropriate for medical therapy by Talreja ??? 07/06/2010  7. History  of ventricular tachycardia.  8. Morbid obesity.  9. Permanent A-fib.  a. Oral AC with Coumadin    SURGICAL HISTORY:  Appendectomy - 2006  Biopsy - 09/16/2011 - renal  Cholecystectomy  Cystoscopy - 09/16/2011  EGD - Esophagogastroduodenoscopy - 12/24/2004  Gall Bladder Surgery - 2002 - removed  Gastric Bypass - 2006  Hernia Repair - 2006   Other or Unknown Surgical History - 1979 - Gastric staples  Pacemaker Implanted - cardiac pacemaker  Stent Placement - 2013  Varicose Vein Ligation - 1980 - varicose viens stripping    SOCIAL HISTORY: Patient denies tobacco, alcohol or illicit drug use.  He uses a motorized wheelchair for assistance and comes in with his wife.    ALLERGIES: amiodarone hcl and keflex              CURRENT MEDICATIONS:  The following new historical medications were added to the med list during this visit:       - Amitiza 24 MCG (1 cap  po twice a day)       - Bisacodyl 10 MG (insert 1 supp rectally prn)       - Calmoseptine 0.44-20.6 % (apply tid daily)       - Enema 7-19 GM/118ML       - Entresto 24-26 MG (1 tab by mouth twice per day)       - Levothyroxine Sodium 100 MCG (1  tab po daily)       - Midodrine HCl 10 MG (take 1 tab po tid)       - MiraLax ((17gms) po daily prn)  Aspirin (81 MG: 1 po qd) - 07/30/2014  Carvedilol (6.25 MG: Take 1 tablet by mouth twice daily with meals) - 07/11/2016  Colcrys (0.6 MG: Take 1 tablet by mouth once daily) - 08/16/2016  Digoxin (125 MCG: Take 1/2 tablet by mouth once daily) - 03/14/2016  Furosemide (40 MG: 1 TAB BID for fluid and heart) - 07/30/2014  Gabapentin (100 MG: 1 cap PO TID) - 08/16/2016  Hydrocodone-Acetaminophen (10-325 MG/15ML: 1 tab po BID as needed for pain) - 08/16/2016  Levothyroxine Sodium (50 MCG: Take 1 tablet by mouth once daily) - 03/14/2016  Levothyroxine Sodium (75 MCG: 1  tab by mouth daily) - 08/16/2016  Lisinopril (2.5 MG: Take 1 tablet by mouth once daily) - 03/15/2016  MetOLazone (2.5 MG: 1 tab po once a day) - 03/15/2016  Multivitamins (1 capsule po daily) - 07/30/2014  Pentoxifylline ER (400 MG: 1 TID) - 08/16/2016  Silvadene (1 %: apply a thin layer to the affected area) - 07/26/2016  Uloric (40 MG: 1 PO daily) - 03/15/2016  Vitamin D (1000 UNIT: 1 tab po daily) - 07/30/2014  Warfarin Sodium (3 MG: 1 tab daily, taken along w/4mg  Tab.) - 03/30/2016   Warfarin Sodium (4 MG: 1 tablet every day or as directed) - 07/30/2014    FAMILY HISTORY: Is negative for premature coronary artery disease and sudden cardiac death.    REVIEW OF SYSTEMS:  Constitutional Symptoms:  Patient denies fever, denies sweat, denies loss of appetite, and reports fatigue  Ears/Nose/Throat/Mouth:  The patient denies bleeding gums and  denies nosebleed  Respiratory:  The patient denies cough, denies wheezing, reports shortness of breath, and denies coughing blood.  Cardiovascular:  The patient denies  chest pain,  and denies palpitations.  Gastrointestinal/GI:  The patient denies nausea, denies blood in stool, denies black stool, denies constipation, and denies  heartburn  Skin:  The patient  denies rash, denies itching, and denies sores or easy bruising.  Musculoskeletal:  The patient denies joint pain.  Neurologic:  The patient reports lightheadedness, denies numbness, reports dizziness, and denies fainting.    Physical Examination:    Constitutional:  He appears to be well-developed and in no acute distress.  Alert and oriented x3.  Eyes: Examination of conjunctivae and lids is normal.  Neck:  No jugular venous distension is noted on exam.  Carotid impulse is normal.  Auscultation of neck reveals absence of carotid bruits.    Respiratory:  Normal Respiratory effort is observed.  Auscultation of lungs reveals normal breath sounds.  There are negative rales, negative rhonchi, and negative expiratory wheezing.  Cardiovascular:  PMI is nondisplaced.  Palpation of heart is normal.  Heart sounds S1 normal, S2 present physiologic, S3 negative, S4 negative.  Rhythm is regular.  No murmur is noted on exam.  Right extremity peripheral edema is quantified as 0.  Left extremity peripheral edema is quantified as 0.  Gastrointestinal:  Bowel sounds are present.  Exam reveals abdomen to be normal and non-distended.  Extremities:  There is negative clubbing, negative  cyanosis, and negative  edema.     EKG Interpretation:  ventricular paced rhythm, 105 bpm    IMPRESSION & PLAN:  1. Hypotension:  He is severely dehydrated with low blood pressure of 60/40 confirmed by MA and FNP.  He is unclear of the medications being held, but states his BP is checked prior to medication administration and if too low, he is not given scheduled antihypertensive doses of medication and given midodrine.  He is currently in a rehabilitation center.  2. In consultation with Dr. Rosario Jacks, referring patient to [facility] ED for further evaluation and treatment of hypotension.  Will follow up 1-2 weeks s/p discharge.  3. Medications reconciled at this visit.    Discussed with Pt. that if having any worsening symptoms of CP, SOB, lightheaded/dizziness, presyncope, or other symptoms of concern, should call the office or report to ED for further management.     Planned follow up in: 1-2 weeks s/p hospital discharge    Thank you for allowing me to participate in the care of this patient.      Provider Signature:    Secundino Ginger, MS, FNP-C  Electronically Signed by Secundino Ginger, MS, FNP-C on 02/22/2017 at 2:47:33 PM

## 2017-02-22 NOTE — ED Notes (Signed)
PCS form completed and given to secretary to arrange transport

## 2017-02-22 NOTE — ED Provider Notes (Addendum)
[facility] Care  Emergency Department Treatment Report    Patient: [name] Age: 71 y.o. Sex: male    Date of Birth: 06/01/1945 Admit Date: 02/22/2017 PCP: Ozella Rocks, DO   MRN: [id number]  CSN: [id number]  MD: Christiana Pellant, MD   Room: ER39/ER39 Time Dictated: 3:18 PM APP: Peoples       Chief Complaint   Hypotension  History of Present Illness   71 y.o. male with congestive heart failure and heart disease, irregular heartbeat, lymphedema, pulmonary hypertension, renal disorder and has defibrillator presenting with BP reading of 60/40 x 3 while at doctors office visit, using same machine and cuff.  He says he is completely asymptomatic.  Denies dizziness, headache, chest pain, shortness of breath, diaphoresis, nausea or vomiting.    He has a long list of medications so he is unsure of any new medications.  Drug interaction check done on up-to-date and shows interaction between Norco and Neurontin, and Coumadin and aspirin.  Patient says his Norco dosage was recently decreased and Neurontin dosage increase 7-8 months ago.  He has been at a rehabilitation center since August.  He says they have told him that his blood pressure has went down and they give him a "pill" by mouth to increase his blood pressure.  He has no symptoms.    Review of Systems   Review of Systems   Constitutional: Negative for chills, diaphoresis and fever.   Respiratory: Negative for shortness of breath (appears to have intermittent SOB when talking).    Cardiovascular: Negative for chest pain and palpitations.   Gastrointestinal: Negative for abdominal pain, nausea and vomiting.   Neurological: Negative for headaches.        Unable to walk since August (reason for staying at Rehab). Chronic swelling to legs and arms.       Past Medical/Surgical History     Past Medical History:   Diagnosis Date   ??? Arthritis    ??? Arthropathy, unspecified, site unspecified    ??? Automatic implantable cardiac defibrillator in situ     ??? Cardiac arrhythmia    ??? CHF (congestive heart failure) (HCC)    ??? Heart disease    ??? Hernia    ??? Irregular heart beat    ??? Kidney stone    ??? Lymphedema    ??? Nerve pain    ??? Pacemaker    ??? Pulmonary hypertension (HCC)    ??? Renal disorder    ??? Renal failure, acute (HCC)    ??? UTI (urinary tract infection)    ??? Varicose veins of lower extremities with ulcer (HCC)    ??? Vision decreased      Past Surgical History:   Procedure Laterality Date   ??? APPENDECTOMY  2/06   ??? HX APPENDECTOMY     ??? HX CHOLECYSTECTOMY     ??? HX FRACTURE TX     ??? HX GASTRIC BYPASS     ??? HX GASTRIC BYPASS  03/15/01   ??? HX HERNIA REPAIR     ??? HX OTHER SURGICAL  08-11-11    [facility],Cystoscopy, Retrograde Pyelograms, Double J Stent on right. Dr Myriam Forehand   ??? HX OTHER SURGICAL  09-06-11    [facility],Cystoscopy, right retrograde pyelogram, right ureteroscopic examination of both upper and lower pole renal collecting systems, laser lithotripsy of rightupper renal calyceal matrix stone, biopsy of right upper renal matrix stone versus tissue, and right 5-French x 22 cm double-J stent times 2 stents  in theupper pole moiety and in the lower pole moiety ureters.Dr. Verdie Mosher   ??? HX PACEMAKER         Social History     Social History     Social History   ??? Marital status: SINGLE     Spouse name: N/A   ??? Number of children: N/A   ??? Years of education: N/A     Social History Main Topics   ??? Smoking status: Never Smoker   ??? Smokeless tobacco: Never Used   ??? Alcohol use 0.5 oz/week     1 Standard drinks or equivalent per week   ??? Drug use: No   ??? Sexual activity: Not Asked     Other Topics Concern   ??? None     Social History Narrative       Family History   History reviewed. No pertinent family history.    Current Medications     Current Outpatient Prescriptions   Medication Sig Dispense Refill   ??? carvedilol (COREG) 6.25 mg tablet Take  by mouth two (2) times daily (with meals).     ??? gabapentin (NEURONTIN) 300 mg capsule Take 300 mg by mouth two (2) times a day.      ??? aspirin delayed-release 81 mg tablet Take  by mouth daily.     ??? carvedilol (COREG) 3.125 mg tablet Take  by mouth every morning.     ??? warfarin (COUMADIN) 2.5 mg tablet Take 2.5 mg by mouth daily.       ??? furosemide (LASIX) 40 mg tablet Take  by mouth daily.       ??? HYDROcodone-acetaminophen (NORCO) 5-325 mg per tablet Take  by mouth.       ??? nitroglycerin (NITROSTAT) 0.4 mg SL tablet by SubLINGual route every five (5) minutes as needed.       ??? MULTIVITAMIN/FA/ZINC ASCORBATE (SOURCECF MULTIVIT WITH ZINC PO) Take  by mouth.       ??? omeprazole (PRILOSEC) 20 mg capsule Take 20 mg by mouth daily.       ??? lisinopril (PRINIVIL) 5 mg tablet Take  by mouth nightly.     ??? pentoxifylline CR (TRENTAL) 400 mg CR tablet Take 400 mg by mouth three (3) times daily (with meals).       ??? digoxin (LANOXIN) 0.25 mg tablet Take  by mouth daily.           Allergies     Allergies   Allergen Reactions   ??? Cephalexin Other (comments)       Physical Exam     Visit Vitals   ??? BP 108/65   ??? Pulse 85   ??? Resp 18   ??? Ht  (1.854 m)   ??? Wt 142.9 kg (315 lb)   ??? SpO2 97%   ??? BMI 41.56 kg/m2     Physical Exam   Constitutional: He is oriented to person, place, and time and well-developed, well-nourished, and in no distress.   HENT:   Head: Normocephalic and atraumatic.   Eyes: Conjunctivae are normal. Pupils are equal, round, and reactive to light.   Neck: Normal range of motion. Neck supple.   Cardiovascular: Normal rate, regular rhythm, normal heart sounds and intact distal pulses.    Pulmonary/Chest: Effort normal and breath sounds normal.   Abdominal: Soft. Bowel sounds are normal.   Neurological: He is alert and oriented to person, place, and time.   Skin: Skin is warm and dry. He is not  diaphoretic.   Psychiatric: Mood, memory and judgment normal.   Vitals reviewed.      Impression and Management Plan   71 yo male presenting with low blood pressure reading at another office. Asymptomatic.   DDX: dehydration, medication reaction, hypovolemia, syncope, cardiac related, BP cuff/machine error  BMP ordered. Give midodrine and 500mL fluids    Diagnostic Studies   Lab:   Recent Results (from the past 12 hour(s))   METABOLIC PANEL, BASIC    Collection Time: 02/22/17  3:00 PM   Result Value Ref Range    Sodium 138 136 - 145 mEq/L    Potassium 3.5 3.5 - 5.1 mEq/L    Chloride 96 (L) 98 - 107 mEq/L    CO2 37 (H) 21 - 32 mEq/L    Glucose 120 (H) 74 - 106 mg/dl    BUN 16 7 - 25 mg/dl    Creatinine 0.9 0.6 - 1.3 mg/dl    GFR est AA >16>60      GFR est non-AA >60      Calcium 8.6 8.5 - 10.1 mg/dl    Anion gap 5 5 - 15 mmol/L       Imaging:    No results found.    Medical Decision Making/ED Course   On arrival patient BP 93/56. He is completely asymptomatic and has been asymptomatic. Says he was talking and interactive at doctor office with BP reading of 60/40, as he is now. BP reading could be due to missed dose of midodrine that he normally receives at assisted living facility. Midodrine given here. CO2 elevated. Awaiting VBG, if normal He will be transferred back to assisted living. Patient remained clinically stable throughout the Emergency Department visit.  Vitals were unremarkable and the patient's condition required no further ED intervention.  The patient is stable for outpatient management with appropriate symptomatic treatment, return precautions, and was advised of the importance of outpatient follow up for ongoing care    **Final dispo to Dr. Luciano CutterHahn    Continuation by Dr. Dub MikesE. Dom Haverland:  Patient signed out to me at change of shift by Dr. Carmela HurtFickenscher.  He remained stable in the ED.  BP remained in the 90s-100s systolic.  Labs revealed elevated CO2.  VBG showed a pH of 7.34 with a PCO2 of 72.  I have [facility].  [facility], but his last ABG from March showed a pH of 7.44 with a PCO2 of 58.  He does appear to be retaining CO2 tonight.  He was discussed with  hospitalist and admitted.        Final Diagnosis       ICD-10-CM ICD-9-CM   1. Respiratory failure with hypercapnia, unspecified chronicity (HCC) J96.92 518.81   2. Hypotension, unspecified hypotension type I95.9 458.9         Disposition     Patient admitted in stable condition.       The patient was personally evaluated by myself and BEN Regenia SkeeterA FICKENSCHER, MD and Dr. Mickie KayEmily Ory Elting who agrees with the above assessment and plan.    Velia MeyerJessica Peoples, PA-C  February 22, 2017      *Portions of this electronic record were dictated using Conservation officer, historic buildingsDragon voice recognition software.  Unintended errors in translation may occur.    My signature above authenticates this document and my orders, the final ??  diagnosis (es), discharge prescription (s), and instructions in the Epic ??  record.  If you have any questions  please contact [phone].  ??  Nursing notes have been reviewed by the physician/ advanced practice ??  Clinician.

## 2017-02-22 NOTE — ED Triage Notes (Signed)
Patient arrive via medical transport from his Dr. Isidore Moos due low blood pressure. Patient denies dizziness, headache and SOB

## 2017-02-22 NOTE — ED Notes (Signed)
Liquid diet given

## 2017-02-22 NOTE — Other (Signed)
TRANSFER - IN REPORT:    Verbal report received from Irving BurtonEmily, RN (name) on [name]  being received from ED (unit).      Report consisted of patient???s Situation, Background, Assessment and   Recommendations(SBAR).     Information from the following report(s) SBAR, Kardex, ED Summary, Intake/Output, MAR, Recent Results and Cardiac Rhythm Paced was reviewed with the receiving nurse.    Opportunity for questions and clarification was provided.      Assessment completed upon patient???s arrival to unit and care assumed.

## 2017-02-22 NOTE — Other (Signed)
TRANSFER - OUT REPORT:    Verbal report given to Gerri RN(name) on [name]  being transferred to [facility](unit) for routine progression of care       Report consisted of patient???s Situation, Background, Assessment and   Recommendations(SBAR).     Information from the following report(s) SBAR was reviewed with the receiving nurse.    Lines:   Peripheral IV 02/22/17 Left Forearm (Active)   Site Assessment Clean, dry, & intact 02/22/2017  8:28 PM   Phlebitis Assessment 0 02/22/2017  8:28 PM   Infiltration Assessment 0 02/22/2017  8:28 PM   Dressing Status Clean, dry, & intact;New;Occlusive 02/22/2017  8:28 PM   Dressing Type Transparent 02/22/2017  8:28 PM   Hub Color/Line Status Capped;Flushed;Patent 02/22/2017  8:28 PM   Action Taken Blood drawn 02/22/2017  8:28 PM   Alcohol Cap Used Yes 02/22/2017  8:28 PM        Opportunity for questions and clarification was provided.      Patient transported with:   O2 @ 2 liters  Registered Nurse  [employer]

## 2017-02-22 NOTE — H&P (Signed)
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  Hypothyroidism  Peripheral arterial disease  Chronic atrial fibrillation  Pulmonary hypertension severe per cath in 2010  Bilateral lymphedema  Nonischemic cardiomyopathy status post bi ventricle AICD          ___________________________________________________  PLAN:    IV fluids given in the emergency room  Oxygen, nebs keep sats more than 92%  Hold diuretics, restart midodrine  Restart Coreg first tomorrow if blood pressure tolerates  We will hold [facility]  Cardiology already aware of the patient  Pulmonary consult  Continue Synthroid, aspirin, pentoxifylline      Risk of deterioration:  [] Low    [x] Moderate  [] High    Prophylaxis:  [] Lovenox  [x] Coumadin  [] Hep SQ  [] SCD???s  [] H2B/PPI    Disposition:  [] Home w/ Family   [] HH PT,OT,RN   [x] SNF/LTC   [] SAH/Rehab    Discussed Code Status:    [x] Full Code      [] DNR       Full code as discussed with patient and patient's 1 sister. Phone number for the power of attorney Sister [name] and the medical decision maker is.  [phone]. Discussed nature of interventions like cardiopulmonary resuscitation/ventilatory support and patient's power of attorney clearly voices that he  would want all life prolonging measures or aggressive interventions as needed to be alive. Time spent exclusively in advance care planning is 17 minutes     Subjective:   CHIEF COMPLAINT:    Chief Complaint   Patient presents with    ??? Hypotension       HISTORY OF PRESENT ILLNESS:     [name] is a 71 y.o. WHITE OR CAUCASIAN male who presents after being sent from cardiology office for profound hypotension  Patient was found to be hypotensive despite holding medications at rehab and being on midodrine  Has dilated cardiomyopathy with EF of 22% and is failed to improve despite cardiac resynchronization therapy  Patient was asked to go to the emergency room and possibly stop Entresto and possibly Coreg  He has been having bilateral lower extremity weakness, dizziness and lightheadedness  Because of his hypotension he has not been able to take his prescribed medications and was started on midodrine to improve his blood pressure  He was recently started on Entresto, Coreg and Lasix in divided doses in addition to midodrine and pain medication for his chronic pain  He was admitted to [facility] and was transferred to rehab following that      Past Medical History:   Diagnosis Date   ??? Arthritis    ??? Arthropathy, unspecified, site unspecified    ??? Automatic implantable cardiac defibrillator in situ    ??? Cardiac arrhythmia    ??? CHF (congestive heart failure) ([place])    ??? Heart disease    ??? Hernia    ??? Irregular  heart beat    ??? Kidney stone    ??? Lymphedema    ??? Nerve pain    ??? Pacemaker    ??? Pulmonary hypertension ([place])    ??? Renal disorder    ??? Renal failure, acute ([place])    ??? UTI (urinary tract infection)    ??? Varicose veins of lower extremities with ulcer ([place])    ??? Vision decreased         Past Surgical History:   Procedure Laterality Date   ??? APPENDECTOMY  2/06   ??? HX APPENDECTOMY     ??? HX CHOLECYSTECTOMY     ??? HX FRACTURE TX     ??? HX GASTRIC BYPASS     ??? HX GASTRIC BYPASS  03/15/01   ??? HX HERNIA REPAIR     ??? HX OTHER SURGICAL  08-11-11    [facility],Cystoscopy, Retrograde Pyelograms, Double J Stent on right. Dr Orland Jarred   ??? HX OTHER SURGICAL  09-06-11    [facility],Cystoscopy, right retrograde pyelogram, right ureteroscopic  examination of both upper and lower pole renal collecting systems, laser lithotripsy of rightupper renal calyceal matrix stone, biopsy of right upper renal matrix stone versus tissue, and right 5-French x 22 cm double-J stent times 2 stents in theupper pole moiety and in the lower pole moiety ureters.Dr. Oleta Mouse   ??? HX PACEMAKER         Social History   Substance Use Topics   ??? Smoking status: Never Smoker   ??? Smokeless tobacco: Never Used   ??? Alcohol use 0.5 oz/week     1 Standard drinks or equivalent per week        History reviewed. No pertinent family history.     Allergies   Allergen Reactions   ??? Cephalexin Other (comments)        Prior to Admission Medications   Prescriptions Last Dose Informant Patient Reported? Taking?   HYDROcodone-acetaminophen (NORCO) 5-325 mg per tablet Unknown at Unknown time  Yes No   Sig: Take  by mouth.     MULTIVITAMIN/FA/ZINC ASCORBATE (SOURCECF MULTIVIT WITH ZINC PO) Unknown at Unknown time  Yes No   Sig: Take  by mouth.     aspirin delayed-release 81 mg tablet Unknown at Unknown time  Yes No   Sig: Take  by mouth daily.   carvedilol (COREG) 3.125 mg tablet Unknown at Unknown time  Yes No   Sig: Take  by mouth every morning.   carvedilol (COREG) 6.25 mg tablet Unknown at Unknown time  Yes No   Sig: Take  by mouth two (2) times daily (with meals).   digoxin (LANOXIN) 0.25 mg tablet Unknown at Unknown time  Yes No   Sig: Take  by mouth daily.     furosemide (LASIX) 40 mg tablet Unknown at Unknown time  Yes No   Sig: Take  by mouth daily.     gabapentin (NEURONTIN) 300 mg capsule Unknown at Unknown time  Yes No   Sig: Take 300 mg by mouth two (2) times a day.   lisinopril (PRINIVIL) 5 mg tablet Unknown at Unknown time  Yes No   Sig: Take  by mouth nightly.   nitroglycerin (NITROSTAT) 0.4 mg SL tablet Unknown at Unknown time  Yes No   Sig: by SubLINGual route every five (5) minutes as needed.     omeprazole (PRILOSEC) 20 mg capsule Unknown at Unknown time  Yes No    Sig: Take 20 mg by mouth daily.  pentoxifylline CR (TRENTAL) 400 mg CR tablet Unknown at Unknown time  Yes No   Sig: Take 400 mg by mouth three (3) times daily (with meals).     warfarin (COUMADIN) 2.5 mg tablet Unknown at Unknown time  Yes No   Sig: Take 2.5 mg by mouth daily.        Facility-Administered Medications: None           REVIEW OF SYSTEMS:     []  Unable to obtain  ROS due to  [] mental status change  [] sedated   [] intubated   [x] Total of 12 systems reviewed as follows:  Constitutional: negative fever, negative chills, negative weight loss  Eyes:   negative visual changes  ENT:   negative sore throat, tongue or lip swelling  Respiratory:  Positive for shortness of breath more when talking  Cards:  negative for chest pain, palpitations, lower extremity edema  GI:   negative for nausea, vomiting, diarrhea, and abdominal pain  Genitourinary: negative for frequency, dysuria  Integument:  negative for rash and pruritus  Hematologic:  negative for easy bruising and gum/nose bleeding  Musculoskel: Positive for chronic swelling in the lower extremities from peripheral vascular disease and venous stasis  Neurological:  Negative for headaches, focal deficits  Behavl/Psych: negative for feelings of anxiety, depression       Objective:   VITALS:    Visit Vitals   ??? BP 95/56   ??? Pulse 89   ??? Temp 98.5 ??F (36.9 ??C)   ??? Resp 15   ??? Ht 6' 1"  (1.854 m)   ??? Wt 142.9 kg (315 lb)   ??? SpO2 95%   ??? BMI 41.56 kg/m2     Temp (24hrs), Avg:98.5 ??F (36.9 ??C), Min:98.5 ??F (36.9 ??C), Max:98.5 ??F (36.9 ??C)      PHYSICAL EXAM:   General:    Likely sick looking mild distress secondary to work of breathing  Head:   Normocephalic, without obvious abnormality, atraumatic.  Eyes:   Conjunctivae clear, anicteric sclerae.  Pupils are equal  Nose:  Nares normal. No drainage or sinus tenderness.  Throat:    Lips, mucosa, and tongue normal.  No Thrush  Neck:  Supple, symmetrical,  no adenopathy, thyroid: non tender     no carotid bruit and no JVD.  Back:    Symmetric,  No CVA tenderness.  Lungs:   Clear to auscultation bilaterally.  No Wheezing or Rhonchi. No rales.  Chest wall:  No tenderness or deformity. No Accessory muscle use.  Heart:   Regular rate and rhythm,  no murmur, rub or gallop.  Abdomen:   Abdominal hernia present easily reducible, abdomen is soft bowel sounds present  Extremities: Bilateral lower extremity swelling 3+, venous stasis, normal dorsalis pedis and posterior tibial pulses  Skin:     Texture, turgor normal. No rashes or lesions.  Not Jaundiced  Psych:  Patient is anxious  Neurologic: EOMs intact. No facial asymmetry. No aphasia or slurred speech. Normal  strength, Alert and oriented X 3.       LAB DATA REVIEWED:    Recent Results (from the past 12 hour(s))   METABOLIC PANEL, BASIC    Collection Time: 02/22/17  3:00 PM   Result Value Ref Range    Sodium 138 136 - 145 mEq/L    Potassium 3.5 3.5 - 5.1 mEq/L    Chloride 96 (L) 98 - 107 mEq/L    CO2 37 (H) 21 - 32 mEq/L    Glucose  120 (H) 74 - 106 mg/dl    BUN 16 7 - 25 mg/dl    Creatinine 0.9 0.6 - 1.3 mg/dl    GFR est AA >60      GFR est non-AA >60      Calcium 8.6 8.5 - 10.1 mg/dl    Anion gap 5 5 - 15 mmol/L   POC BLOOD GAS    Collection Time: 02/22/17  7:59 PM   Result Value Ref Range    pH 7.342 7.310 - 7.410      PCO2 72.2 (HH) 41.0 - 51.0 mm Hg    PO2 34.0 (L) 35 - 40 mm Hg    BICARBONATE 39.1 (H) 22.0 - 26.0 mmol/L    O2 SAT 58.0 (L) 70 - 75 %    CO2, TOTAL 41 (HH) 21 - 32 mmol/L    BASE EXCESS 13 (H) -2 - 3 mmol/L    Sample type VEN     CBC WITH AUTOMATED DIFF    Collection Time: 02/22/17  8:20 PM   Result Value Ref Range    WBC 5.8 4.0 - 11.0 1000/mm3    RBC 4.51 3.80 - 5.70 M/uL    HGB 13.0 12.4 - 17.2 gm/dl    HCT 42.8 37.0 - 50.0 %    MCV 94.9 80.0 - 98.0 fL    MCH 28.8 23.0 - 34.6 pg    MCHC 30.4 30.0 - 36.0 gm/dl    PLATELET 185 140 - 450 1000/mm3    MPV 11.0 (H) 6.0 - 10.0 fL    RDW-SD 62.1 (H) 35.1 - 43.9      NRBC 0 0 - 0       IMMATURE GRANULOCYTES 0.2 0.0 - 3.0 %    NEUTROPHILS 66.4 (H) 34 - 64 %    LYMPHOCYTES 24.4 (L) 28 - 48 %    MONOCYTES 7.0 1 - 13 %    EOSINOPHILS 1.5 0 - 5 %    BASOPHILS 0.5 0 - 3 %   NT-PRO BNP    Collection Time: 02/22/17  8:20 PM   Result Value Ref Range    NT pro-BNP 4091.0 (H) 0.0 - 914.7 pg/ml   METABOLIC PANEL, COMPREHENSIVE    Collection Time: 02/22/17  8:20 PM   Result Value Ref Range    Sodium 140 136 - 145 mEq/L    Potassium 3.4 (L) 3.5 - 5.1 mEq/L    Chloride 96 (L) 98 - 107 mEq/L    CO2 38 (H) 21 - 32 mEq/L    Glucose 96 74 - 106 mg/dl    BUN 16 7 - 25 mg/dl    Creatinine 0.9 0.6 - 1.3 mg/dl    GFR est AA >60      GFR est non-AA >60      Calcium 8.4 (L) 8.5 - 10.1 mg/dl    AST (SGOT) 19 15 - 37 U/L    ALT (SGPT) 12 12 - 78 U/L    Alk. phosphatase 113 45 - 117 U/L    Bilirubin, total 1.4 (H) 0.2 - 1.0 mg/dl    Protein, total 6.5 6.4 - 8.2 gm/dl    Albumin 2.2 (L) 3.4 - 5.0 gm/dl    Anion gap 6 5 - 15 mmol/L   LIPASE    Collection Time: 02/22/17  8:20 PM   Result Value Ref Range    Lipase 58 (L) 73 - 393 U/L         IMAGING RESULTS:  Xr Chest Pa Lat    Result Date: 02/22/2017  EXAM: Frontal and lateral views of the chest. INDICATIONS: CHF COMPARISON: Chest radiograph dated 09/13/2011.     Impression: Prominent enlargement of the cardiac silhouette. Mild engorgement of the central central pulmonary vasculature may at most represent mild pulmonary vascular congestion. No overt congestive change. No large effusions. Pacemaker is unchanged. Probable calcified lymph nodes, suggesting granulomatous disease.       Care Plan discussed with:     [x] Patient   [] Family    [] ED Care Manager  [] ED Doc   [] Specialist :    Total care time spent on reviewing the case/data/notes/EMR,examining the patient,documentation,coordinating care with nurses and consultants is  41 minutes.       ___________________________________________________  Admitting Physician: Charlaine Dalton, MD      Dragon medical dictation software was used for portions of this report.  Unintended voice transcription errors may have occurred.

## 2017-02-23 LAB — NT-PRO BNP: NT pro-BNP: 4091 pg/ml — ABNORMAL HIGH (ref 0.0–125.0)

## 2017-02-23 LAB — METABOLIC PANEL, BASIC
Anion gap: 6 mmol/L (ref 5–15)
BUN: 16 mg/dl (ref 7–25)
CO2: 37 mEq/L — ABNORMAL HIGH (ref 21–32)
Calcium: 8 mg/dl — ABNORMAL LOW (ref 8.5–10.1)
Chloride: 98 mEq/L (ref 98–107)
Creatinine: 0.9 mg/dl (ref 0.6–1.3)
GFR est AA: >60
GFR est non-AA: >60
Glucose: 92 mg/dl (ref 74–106)
Potassium: 3.2 mEq/L — ABNORMAL LOW (ref 3.5–5.1)
Sodium: 142 mEq/L (ref 136–145)

## 2017-02-23 LAB — METABOLIC PANEL, COMPREHENSIVE
ALT (SGPT): 12 U/L (ref 12–78)
AST (SGOT): 19 U/L (ref 15–37)
Albumin: 2.2 gm/dl — ABNORMAL LOW (ref 3.4–5.0)
Alk. phosphatase: 113 U/L (ref 45–117)
Anion gap: 6 mmol/L (ref 5–15)
BUN: 16 mg/dl (ref 7–25)
Bilirubin, total: 1.4 mg/dl — ABNORMAL HIGH (ref 0.2–1.0)
CO2: 38 mEq/L — ABNORMAL HIGH (ref 21–32)
Calcium: 8.4 mg/dl — ABNORMAL LOW (ref 8.5–10.1)
Chloride: 96 mEq/L — ABNORMAL LOW (ref 98–107)
Creatinine: 0.9 mg/dl (ref 0.6–1.3)
GFR est AA: >60
GFR est non-AA: >60
Glucose: 96 mg/dl (ref 74–106)
Potassium: 3.4 mEq/L — ABNORMAL LOW (ref 3.5–5.1)
Protein, total: 6.5 gm/dl (ref 6.4–8.2)
Sodium: 140 mEq/L (ref 136–145)

## 2017-02-23 LAB — CBC WITH AUTOMATED DIFF
BASOPHILS: 0.5 % (ref 0–3)
BASOPHILS: 0.4 % (ref 0–3)
EOSINOPHILS: 1.5 % (ref 0–5)
EOSINOPHILS: 2.3 % (ref 0–5)
HCT: 42.8 % (ref 37.0–50.0)
HCT: 38 % (ref 37.0–50.0)
HGB: 13 gm/dl (ref 12.4–17.2)
HGB: 11.8 gm/dl — ABNORMAL LOW (ref 12.4–17.2)
IMMATURE GRANULOCYTES: 0.2 % (ref 0.0–3.0)
IMMATURE GRANULOCYTES: 0.2 % (ref 0.0–3.0)
LYMPHOCYTES: 24.4 % — ABNORMAL LOW (ref 28–48)
LYMPHOCYTES: 23.6 % — ABNORMAL LOW (ref 28–48)
MCH: 28.8 pg (ref 23.0–34.6)
MCH: 29.6 pg (ref 23.0–34.6)
MCHC: 30.4 gm/dl (ref 30.0–36.0)
MCHC: 31.1 gm/dl (ref 30.0–36.0)
MCV: 94.9 fL (ref 80.0–98.0)
MCV: 95.5 fL (ref 80.0–98.0)
MONOCYTES: 7 % (ref 1–13)
MONOCYTES: 8.8 % (ref 1–13)
MPV: 11 fL — ABNORMAL HIGH (ref 6.0–10.0)
MPV: 11.4 fL — ABNORMAL HIGH (ref 6.0–10.0)
NEUTROPHILS: 66.4 % — ABNORMAL HIGH (ref 34–64)
NEUTROPHILS: 64.7 % — ABNORMAL HIGH (ref 34–64)
NRBC: 0 (ref 0–0)
NRBC: 0 (ref 0–0)
PLATELET: 185 10*3/uL (ref 140–450)
PLATELET: 148 10*3/uL (ref 140–450)
RBC: 4.51 M/uL (ref 3.80–5.70)
RBC: 3.98 M/uL (ref 3.80–5.70)
RDW-SD: 62.1 — ABNORMAL HIGH (ref 35.1–43.9)
RDW-SD: 62.1 — ABNORMAL HIGH (ref 35.1–43.9)
WBC: 5.8 10*3/uL (ref 4.0–11.0)
WBC: 5.2 10*3/uL (ref 4.0–11.0)

## 2017-02-23 LAB — DIGOXIN: Digoxin level: 0.7 ng/ml — ABNORMAL LOW (ref 0.9–2.0)

## 2017-02-23 LAB — POC BLOOD GAS
BASE EXCESS: 13 mmol/L — ABNORMAL HIGH (ref <=3)
BICARBONATE: 39.1 mmol/L — ABNORMAL HIGH (ref 22.0–26.0)
CO2, TOTAL: 41 mmol/L — CR (ref 21–32)
O2 SAT: 58 % — ABNORMAL LOW (ref 70–75)
PCO2: 72.2 mm Hg — CR (ref 41.0–51.0)
PO2: 34 mm Hg — ABNORMAL LOW (ref 35–40)
pH: 7.342 (ref 7.310–7.410)

## 2017-02-23 LAB — PROTHROMBIN TIME + INR
INR: 2.8 — ABNORMAL HIGH (ref 0.1–1.1)
INR: 3 — ABNORMAL HIGH (ref 0.1–1.1)
Prothrombin time: 33.3 seconds — ABNORMAL HIGH (ref 10.2–12.9)
Prothrombin time: 36.9 seconds — ABNORMAL HIGH (ref 10.2–12.9)

## 2017-02-23 LAB — TROPONIN I
Troponin-I: 0.024 ng/ml (ref 0.000–0.045)
Troponin-I: 0.02 ng/ml (ref 0.000–0.045)
Troponin-I: 0.021 ng/ml (ref 0.000–0.045)

## 2017-02-23 LAB — LIPASE: Lipase: 58 U/L — ABNORMAL LOW (ref 73–393)

## 2017-02-23 MED ORDER — WARFARIN 2 MG TAB
2 mg | ORAL | Status: DC
Start: 2017-02-23 — End: 2017-02-23

## 2017-02-23 MED ORDER — FEBUXOSTAT 40 MG TABLET
40 mg | Freq: Every day | ORAL | Status: DC
Start: 2017-02-23 — End: 2017-03-01
  Administered 2017-02-23 – 2017-03-01 (×7): via ORAL

## 2017-02-23 MED ORDER — OMEPRAZOLE 20 MG CAP, DELAYED RELEASE
20 mg | Freq: Every day | ORAL | Status: DC
Start: 2017-02-23 — End: 2017-03-01
  Administered 2017-02-23 – 2017-03-01 (×7): via ORAL

## 2017-02-23 MED ORDER — POTASSIUM CHLORIDE SR 20 MEQ TAB, PARTICLES/CRYSTALS
20 mEq | ORAL | Status: AC
Start: 2017-02-23 — End: 2017-02-23
  Administered 2017-02-23: 12:00:00 via ORAL

## 2017-02-23 MED ORDER — POTASSIUM CHLORIDE SR 20 MEQ TAB, PARTICLES/CRYSTALS
20 mEq | Freq: Two times a day (BID) | ORAL | Status: DC
Start: 2017-02-23 — End: 2017-03-01
  Administered 2017-02-24 – 2017-03-01 (×11): via ORAL

## 2017-02-23 MED ORDER — WARFARIN 1 MG TAB NON-SPLITTABLE
1 mg | Freq: Once | ORAL | Status: AC
Start: 2017-02-23 — End: 2017-02-23
  Administered 2017-02-23: 21:00:00 via ORAL

## 2017-02-23 MED ORDER — ALUM-MAG HYDROXIDE-SIMETH 200 MG-200 MG-20 MG/5 ML ORAL SUSP
200-200-20 mg/5 mL | ORAL | Status: DC | PRN
Start: 2017-02-23 — End: 2017-03-01

## 2017-02-23 MED ORDER — WARFARIN 3 MG TAB
3 mg | ORAL | Status: DC
Start: 2017-02-23 — End: 2017-02-23

## 2017-02-23 MED ORDER — HYDROCODONE-ACETAMINOPHEN 5 MG-325 MG TAB
5-325 mg | ORAL | Status: DC | PRN
Start: 2017-02-23 — End: 2017-03-01
  Administered 2017-02-23: 12:00:00 via ORAL

## 2017-02-23 MED ORDER — FUROSEMIDE 40 MG TAB
40 mg | Freq: Two times a day (BID) | ORAL | Status: DC
Start: 2017-02-23 — End: 2017-02-23

## 2017-02-23 MED ORDER — NALOXONE 0.4 MG/ML INJECTION
0.4 mg/mL | INTRAMUSCULAR | Status: DC | PRN
Start: 2017-02-23 — End: 2017-03-01

## 2017-02-23 MED ORDER — GABAPENTIN 100 MG CAP
100 mg | Freq: Three times a day (TID) | ORAL | Status: DC
Start: 2017-02-23 — End: 2017-03-01
  Administered 2017-02-23 – 2017-03-01 (×19): via ORAL

## 2017-02-23 MED ORDER — LEVOTHYROXINE 25 MCG TAB
25 mcg | ORAL | Status: DC
Start: 2017-02-23 — End: 2017-02-23
  Administered 2017-02-23: 13:00:00 via ORAL

## 2017-02-23 MED ORDER — MIDODRINE 5 MG TAB
5 mg | Freq: Three times a day (TID) | ORAL | Status: DC
Start: 2017-02-23 — End: 2017-02-25
  Administered 2017-02-23 – 2017-02-25 (×8): via ORAL

## 2017-02-23 MED ORDER — PHARMACY WARFARIN NOTE
Status: DC
Start: 2017-02-23 — End: 2017-02-24

## 2017-02-23 MED ORDER — DIGOXIN 0.125 MG TAB
0.125 mg | ORAL | Status: DC
Start: 2017-02-23 — End: 2017-03-01
  Administered 2017-02-23 – 2017-03-01 (×4): via ORAL

## 2017-02-23 MED ORDER — MORPHINE 2 MG/ML INJECTION
2 mg/mL | INTRAMUSCULAR | Status: DC | PRN
Start: 2017-02-23 — End: 2017-03-01

## 2017-02-23 MED ORDER — PENTOXIFYLLINE SR 400 MG TAB
400 mg | Freq: Three times a day (TID) | ORAL | Status: DC
Start: 2017-02-23 — End: 2017-03-01
  Administered 2017-02-23 – 2017-03-01 (×20): via ORAL

## 2017-02-23 MED ORDER — ASPIRIN 81 MG TAB, DELAYED RELEASE
81 mg | Freq: Every day | ORAL | Status: DC
Start: 2017-02-23 — End: 2017-03-01
  Administered 2017-02-23 – 2017-03-01 (×7): via ORAL

## 2017-02-23 MED ORDER — SODIUM CHLORIDE 0.9 % IJ SYRG
Freq: Three times a day (TID) | INTRAMUSCULAR | Status: DC
Start: 2017-02-23 — End: 2017-03-01
  Administered 2017-02-23 – 2017-03-01 (×22): via INTRAVENOUS

## 2017-02-23 MED ORDER — FUROSEMIDE 20 MG TAB
20 mg | Freq: Two times a day (BID) | ORAL | Status: DC
Start: 2017-02-23 — End: 2017-02-24
  Administered 2017-02-24 (×2): via ORAL

## 2017-02-23 MED ORDER — SODIUM CHLORIDE 0.9 % IJ SYRG
INTRAMUSCULAR | Status: DC | PRN
Start: 2017-02-23 — End: 2017-03-01
  Administered 2017-02-23 – 2017-02-28 (×3): via INTRAVENOUS

## 2017-02-23 MED ORDER — CARVEDILOL 3.125 MG TAB
3.125 mg | Freq: Two times a day (BID) | ORAL | Status: DC
Start: 2017-02-23 — End: 2017-02-24
  Administered 2017-02-23: 12:00:00 via ORAL

## 2017-02-23 MED ORDER — LEVOTHYROXINE 100 MCG TAB
100 mcg | ORAL | Status: DC
Start: 2017-02-23 — End: 2017-03-01
  Administered 2017-02-24 – 2017-03-01 (×6): via ORAL

## 2017-02-23 MED ORDER — ALBUTEROL SULFATE 0.083 % (0.83 MG/ML) SOLN FOR INHALATION
2.5 mg /3 mL (0.083 %) | Freq: Four times a day (QID) | RESPIRATORY_TRACT | Status: DC | PRN
Start: 2017-02-23 — End: 2017-03-01

## 2017-02-23 MED ORDER — ACETAMINOPHEN 325 MG TABLET
325 mg | Freq: Four times a day (QID) | ORAL | Status: DC | PRN
Start: 2017-02-23 — End: 2017-03-01

## 2017-02-23 MED ORDER — SODIUM CHLORIDE 0.9% BOLUS IV
0.9 % | Freq: Once | INTRAVENOUS | Status: AC
Start: 2017-02-23 — End: 2017-02-23
  Administered 2017-02-23: 15:00:00 via INTRAVENOUS

## 2017-02-23 MED FILL — PENTOXIFYLLINE SR 400 MG TAB: 400 mg | ORAL | Qty: 1

## 2017-02-23 MED FILL — POTASSIUM CHLORIDE SR 20 MEQ TAB, PARTICLES/CRYSTALS: 20 mEq | ORAL | Qty: 3

## 2017-02-23 MED FILL — OMEPRAZOLE 20 MG CAP, DELAYED RELEASE: 20 mg | ORAL | Qty: 1

## 2017-02-23 MED FILL — PHARMACY WARFARIN NOTE: Qty: 1

## 2017-02-23 MED FILL — FUROSEMIDE 40 MG TAB: 40 mg | ORAL | Qty: 1

## 2017-02-23 MED FILL — CARVEDILOL 3.125 MG TAB: 3.125 mg | ORAL | Qty: 1

## 2017-02-23 MED FILL — HYDROCODONE-ACETAMINOPHEN 5 MG-325 MG TAB: 5-325 mg | ORAL | Qty: 1

## 2017-02-23 MED FILL — MIDODRINE 5 MG TAB: 5 mg | ORAL | Qty: 2

## 2017-02-23 MED FILL — BD POSIFLUSH NORMAL SALINE 0.9 % INJECTION SYRINGE: INTRAMUSCULAR | Qty: 10

## 2017-02-23 MED FILL — ULORIC 40 MG TABLET: 40 mg | ORAL | Qty: 1

## 2017-02-23 MED FILL — ASPIRIN 81 MG TAB, DELAYED RELEASE: 81 mg | ORAL | Qty: 1

## 2017-02-23 MED FILL — GABAPENTIN 100 MG CAP: 100 mg | ORAL | Qty: 2

## 2017-02-23 MED FILL — DIGOXIN 0.125 MG TAB: 0.125 mg | ORAL | Qty: 1

## 2017-02-23 MED FILL — COUMADIN 1 MG TABLET: 1 mg | ORAL | Qty: 1

## 2017-02-23 MED FILL — SODIUM CHLORIDE 0.9 % IV: INTRAVENOUS | Qty: 250

## 2017-02-23 NOTE — Wound Image (Signed)
Physical Exam     Inpatient Wound / Ostomy Nurse Progress Note    Patient Name/Account: [name],[name][[id number]]  Date: 02-23-17    Situation:  Wound/ostomy consult    Background:  71 year old male admitted to the hospital with acute hypercapnic respiratory failure.  He has a past medical history of hyponatremia, hematuria, CAD, defibrillator discharge, cardiomyopathy, a-fib, LBBB, osteoarthrosis in hand, pulmonary hypertension, HTN, obesity, edema, PVD and lymphedema.  Patient scored 13 on the Braden Scale.    Assessment:      1.  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  Wound bed red and measures 1 x 1 x 1 with undermining circumferentially to 0.7 cm.      2.  Posterior left thigh, full-thickness wound measures 0.4 cm round with depth of 0.1 cm.  Wound bed yellow and appears stable.        Recommendation:      1.  Wound packed with Aquacel ag and covered with Mepilex.    2.  Calmoseptine applied.    Patient scored at risk for additional pressure injury development, ensure prevention protocols in place.  Patient is obese and does not turn on his own.    Thank you,    Luci Bankhonda Johnson, BSN, RN, [employer], [facility]FCN  [facility]  Inpatient Wound, Ostomy, Foot Care Nurse  [facility]  [phone], P) [phone]

## 2017-02-23 NOTE — Progress Notes (Addendum)
[facility]   CARDIOLOGY PROGRESS NOTE    PCP:  Dr. Lynnell Catalan  [facility] Cardiologist:  Dr. Rozell Searing    RECS:  cautious hydration  Holding Entresto and metolazone  Decreased Coreg dose - remains on midodrine    ASSESSMENT:       Dehydration and hypotension  Non-obstructive CAD   NST is negative for ischemia with mildly dilated LV cavity, mod. global hypokinesis with EF 30% - 08/03/2016  Dilated Cardiomyopathy   Echo 07/27/2016: EF 30-35% with dilated LV and mild LVH, reduced LV systolic function with global hypokinesis,     severely dilated LA, no valvular pathology and dilated aortic root (4 cm)   Echo 01/12/17: EF 22% with severely decreased LV function, grade III diastolic dysfunction,    mod-severe basal to mid anterolateral and inferolateral wall segments, severely dilated LA,     dilated RH with normal systolic function, severe MR, mild Mitral annular calcification,     mod TR and RVSP 56 mmHG   [place] [employer] functional class 2 symptoms at baseline with chronic systolic heart failure.   s/p St. JudeBi-V ICD implant by Onufer ??? 03/23/2009    Low Bi-V and ventricular pacing percentage due to A-fib.  Non-obstructive coronary artery disease   Cardiac cath by Alimard ??? 03/20/2009 with mild non-obstructive CAD with mild luminal irregularities,     severely dilated LV with severe LV dysfunction, LVEF 20% with global hypokinesis, severe pulmonary hypertension    Cardiac cath by Talreja ??? 07/06/2010 with reduced EF without significant valve disease and mild CAD appropriate for medical therapy   History of ventricular tachycardia.  ALLERGIES: amiodarone hcl and keflex  Morbid obesity.  Permanent A-fib - Oral AC with Coumadin  SURGICAL HISTORY:  Appendectomy - 2006  Biopsy - 09/16/2011 - renal  Cholecystectomy  Cystoscopy - 09/16/2011  EGD - Esophagogastroduodenoscopy - 12/24/2004  Gall Bladder Surgery - 2002 - removed  Gastric Bypass - 2006  Hernia Repair - 2006   Other or Unknown Surgical History - 1979 - Gastric staples  Pacemaker Implanted - cardiac pacemaker  Stent Placement - 2013  Varicose Vein Ligation - 1980 - varicose viens stripping    SUBJECTIVE:  denies CP or SOB    VS:   Visit Vitals   ??? BP 110/71   ??? Pulse 89   ??? Temp 97.5 ??F (36.4 ??C)   ??? Resp 19   ??? Ht 6\' 1"  (1.854 m)   ??? Wt 144.4 kg (318 lb 6.4 oz)   ??? SpO2 93%   ??? BMI 42.01 kg/m2       Intake/Output Summary (Last 24 hours) at 02/23/17 1019  Last data filed at 02/23/17 5784   Gross per 24 hour   Intake              240 ml   Output              500 ml   Net             -260 ml     TELE personally reviewed by me:  AFib, 100% BiV-paced    EXAM:  General:  Skin warm, perfusion adequate  Neck: JVD is absent  Lungs: clear  Cardiac:  Regular Rhythm, MR murmur  Abdomen: soft, nl bowel sounds  Ext: Edema is present  Neuro: Alert and oriented    Labs:    Basic Metabolic Profile   Lab Results   Component Value Date/Time  NA 142 02/23/2017 03:47 AM    NA 140 02/22/2017 08:20 PM    NA 138 02/22/2017 03:00 PM    K 3.2 (L) 02/23/2017 03:47 AM    K 3.4 (L) 02/22/2017 08:20 PM    K 3.5 02/22/2017 03:00 PM    CL 98 02/23/2017 03:47 AM    CL 96 (L) 02/22/2017 08:20 PM    CL 96 (L) 02/22/2017 03:00 PM    CO2 37 (H) 02/23/2017 03:47 AM    CO2 38 (H) 02/22/2017 08:20 PM    CO2 41 (HH) 02/22/2017 07:59 PM    CO2 37 (H) 02/22/2017 03:00 PM    BUN 16 02/23/2017 03:47 AM    BUN 16 02/22/2017 08:20 PM    BUN 16 02/22/2017 03:00 PM    CREA 0.9 02/23/2017 03:47 AM    CREA 0.9 02/22/2017 08:20 PM    CREA 0.9 02/22/2017 03:00 PM    GLU 92 02/23/2017 03:47 AM    GLU 96 02/22/2017 08:20 PM    GLU 120 (H) 02/22/2017 03:00 PM    CA 8.0 (L) 02/23/2017 03:47 AM    CA 8.4 (L) 02/22/2017 08:20 PM    CA 8.6 02/22/2017 03:00 PM    MG 2.1 03/19/2009 02:40 AM    MG 1.9 03/17/2009 08:05 PM        CBC w/Diff    Lab Results   Component Value Date/Time    WBC 5.2 02/23/2017 03:47 AM    WBC 5.8 02/22/2017 08:20 PM    WBC 8.5 03/19/2009 02:40 AM     RBC 3.98 02/23/2017 03:47 AM    RBC 4.51 02/22/2017 08:20 PM    RBC 4.13 (L) 03/19/2009 02:40 AM    HGB 11.8 (L) 02/23/2017 03:47 AM    HGB 13.0 02/22/2017 08:20 PM    HGB 12.6 (L) 03/19/2009 02:40 AM    HCT 38.0 02/23/2017 03:47 AM    HCT 42.8 02/22/2017 08:20 PM    HCT 37.9 03/19/2009 02:40 AM    MCV 95.5 02/23/2017 03:47 AM    MCV 94.9 02/22/2017 08:20 PM    MCV 91.8 03/19/2009 02:40 AM    MCH 29.6 02/23/2017 03:47 AM    MCH 28.8 02/22/2017 08:20 PM    MCH 30.6 03/19/2009 02:40 AM    MCHC 31.1 02/23/2017 03:47 AM    MCHC 30.4 02/22/2017 08:20 PM    MCHC 33.3 03/19/2009 02:40 AM    RDW 18.9 (H) 03/19/2009 02:40 AM    RDW 18.0 (H) 03/17/2009 08:05 PM    PLT 148 02/23/2017 03:47 AM    PLT 185 02/22/2017 08:20 PM    PLT 190 03/19/2009 02:40 AM    Lab Results   Component Value Date/Time    ANEU 7.3 03/19/2009 02:40 AM    ANEU 6.7 03/17/2009 08:05 PM    ABL 0.6 (L) 03/19/2009 02:40 AM    ABL 0.5 (L) 03/17/2009 08:05 PM    ABM 0.6 03/19/2009 02:40 AM    ABM 0.4 03/17/2009 08:05 PM    ABE 0.1 03/19/2009 02:40 AM    ABE 0.0 03/17/2009 08:05 PM    ABB 0.0 03/19/2009 02:40 AM    ABB 0.0 03/17/2009 08:05 PM    GRANS 64.7 (H) 02/23/2017 03:47 AM    GRANS 66.4 (H) 02/22/2017 08:20 PM    GRANS 85 (H) 03/19/2009 02:40 AM    LYMPH 23.6 (L) 02/23/2017 03:47 AM    LYMPH 24.4 (L) 02/22/2017 08:20 PM    LYMPH 7 (L)  03/19/2009 02:40 AM    MONOS 8.8 02/23/2017 03:47 AM    MONOS 7.0 02/22/2017 08:20 PM    MONOS 7 03/19/2009 02:40 AM    EOS 2.3 02/23/2017 03:47 AM    EOS 1.5 02/22/2017 08:20 PM    EOS 1 03/19/2009 02:40 AM    BASOS 0.4 02/23/2017 03:47 AM    BASOS 0.5 02/22/2017 08:20 PM    BASOS 0 03/19/2009 02:40 AM        Cardiac Enzymes   Lab Results   Component Value Date/Time    CPK 158 03/18/2009 01:12 PM    CPK 151 03/18/2009 05:42 AM    CPK 96 03/17/2009 08:05 PM    CKMB 1.3 03/18/2009 01:12 PM    CKMB 2.2 03/18/2009 05:42 AM    CKMB 1.8 03/17/2009 08:05 PM        Coagulation   Lab Results   Component Value Date/Time     PTP 36.9 (H) 02/23/2017 03:47 AM    PTP 33.3 (H) 02/22/2017 08:20 PM    INR 3.0 (H) 02/23/2017 03:47 AM    INR 2.8 (H) 02/22/2017 08:20 PM        Lab Results   Component Value Date/Time    INR 3.0 (H) 02/23/2017 03:47 AM    INR 2.8 (H) 02/22/2017 08:20 PM     Lab Results   Component Value Date/Time    HGB 11.8 (L) 02/23/2017 03:47 AM    HGB 13.0 02/22/2017 08:20 PM    HGB 12.6 (L) 03/19/2009 02:40 AM     Lab Results   Component Value Date/Time    HCT 38.0 02/23/2017 03:47 AM    HCT 42.8 02/22/2017 08:20 PM    HCT 37.9 03/19/2009 02:40 AM       Medications Reviewed:  Current Facility-Administered Medications   Medication Dose Route Frequency   ??? furosemide (LASIX) tablet 40 mg  40 mg Oral ACB&D   ??? [START ON 02/24/2017] potassium chloride (K-DUR, KLOR-CON) SR tablet 20 mEq  20 mEq Oral BID   ??? febuxostat (ULORIC) tablet 40 mg  40 mg Oral DAILY   ??? gabapentin (NEURONTIN) capsule 200 mg  200 mg Oral TID   ??? [START ON 02/24/2017] levothyroxine (SYNTHROID) tablet 100 mcg  100 mcg Oral 6am   ??? aspirin delayed-release tablet 81 mg  81 mg Oral DAILY   ??? carvedilol (COREG) tablet 3.125 mg  3.125 mg Oral BID WITH MEALS   ??? digoxin (LANOXIN) tablet 0.125 mg  0.125 mg Oral EVERY OTHER DAY   ??? omeprazole (PRILOSEC) capsule 20 mg  20 mg Oral DAILY   ??? pentoxifylline CR (TRENTAL) tablet 400 mg  400 mg Oral TID WITH MEALS   ??? midodrine (PROAMITINE) tablet 10 mg  10 mg Oral TID WITH MEALS   ??? sodium chloride (NS) flush 5-10 mL  5-10 mL IntraVENous Q8H   ??? [START ON 02/24/2017] warfarin (COUMADIN) tablet 3 mg  3 mg Oral Once per day on Mon Wed Fri   ??? *Warfarin Pharmacy to Dose  1 Each Other Rx Dosing/Monitoring   ??? warfarin (COUMADIN) tablet 4 mg  4 mg Oral Once per day on Sun Tue Thu Sat         Akeiba Axelson C. Dian Situ, MD, FACC, FCCP  [facility], [facility]  Phone:  [phone]    02/23/2017  10:19 AM

## 2017-02-23 NOTE — Progress Notes (Signed)
Current discharge plan is long term care.  Patient was at [facility] for rehab, and was out of the building for a physician appointment when he was sent to the ED.  Patient admitted due to hypoxic respiratory failure, hypotension, and CHF.  Call received from Tasha at [facility] to state that they would not be able to accept the patient back.  Rodney Booze stated that [facility] has declined any further rehab authorization, and that patient was not self pay.   Rodney Booze further stated that she fet that the patient's demeanor with the staff had deteriorated, she believed because he was ready to go home.  She further states that patient is able to return home.  Instructed Tasha that I have [place] on file, and asked why they had him listed as self pay.  Rodney Booze stated that they do not have any Medicaid information.   Verified that patient has active Medicaid, and attempted to call Rodney Booze again, however, I had to leave a message. [facility] has declined the patient's return in [place].    Discussed discharge with patient and his sister at length.  Patient would like to return to the sister's home, however, Ms. Allyne Gee says that he will need long term care placement as she is not physically able to care for him.  PT/OT consults are pending.  Patient does admit that he is total care.   Patient loaded into [facility] for long term care placement for [facility],  [place], and [place] facilities.  Patient and his sister to discuss further his discharge options.  PT/OT consults are pending.  Continue to monitor.      Patient admitted on 02/22/2017 from rehab with   Chief Complaint   Patient presents with   ??? Hypotension        The patient has been admitted to the hospital 0 times in the past 12 months.    Previous 4 Admission Dates Admission and Discharge Diagnosis Interventions Barriers Disposition                                   Tentative dc plan:    Long term care     Facility if plan:  Unsure at this time    Anticipated Discharge Date:   02/24/2017    PCP: Ozella Rocks, DO     Specialists:     Cardiology    Face sheet information, address, contact info and insurance verified :  yes    Dialysis Unit/ chair time / access:    N/A    Pharmacy:     [facility]    Any issues getting medications:  None    DME at home and provider:    Dan Humphreys, wheelchair, bedside commode, and shower chair in the home    O2 at home :  None  Provider:  N/A    Home Environment:    Lives at [address]  [place] [address]    [phone].     Prior to admission open services:     None    Home Health Agency-     N/A    Personal Care Agency-    N/A    Extended Emergency Contact Information  Primary Emergency Contact: Renee Rival  Home Phone: [phone]  Relation: Other Relative      Transportation:     medical will transport home  Therapy Recommendations:  OT :y/n     yes  PT :y/n     yes  SLP :y/n    N/A    RT Home O2 Evaluation :y/n     yes    Wound Care: y/n     N/A    Change consult (formerly [facility]) :    N/A    Case Management Assessment    ABUSE/NEGLECT SCREENING   Physical Abuse/Neglect: Denies   Sexual Abuse: Denies   Sexual Abuse: Denies   Other Abuse/Issues: Denies          PRIMARY DECISION MAKER                                   CARE MANAGEMENT INTERVENTIONS   Readmission Interview Completed: Not Applicable               Mode of Transport at Discharge: BLS       Transition of Care Consult (CM Consult): Long Term Care               Discharge Durable Medical Equipment: Yes (walker, wheelchair, bedside commode, and shower chair in the home)               Current Support Network: Other (living with elederly sister prior to being admitted to rehab in August)   Reason for Referral: DCP Rounds   History Provided By: Patient, Child/Family   Patient Orientation: Alert and Oriented, Person, Place, Situation, Self   Cognition: Alert   Support System Response: Concerned, Cooperative    Previous Living Arrangement: Lives with Family Dependent   Home Accessibility: Ramp   Prior Functional Level: Assistance with the following:, Bathing, Dressing, Toileting, Cooking, Housework, Shopping, Mobility   Current Functional Level: Assistance with the following:, Bathing, Dressing, Toileting, Cooking, Housework, Shopping, Mobility   Primary Language: English   Can patient return to prior living arrangement: No   Ability to make needs known:: Good   Family able to assist with home care needs:: No   Pets: None               Anticipated Discharge Needs: Skilled Nursing Facility   Confirm Follow Up Transport: Other (see comment) (medical transport)   Confirm Transport and Arrange: Yes   Plan discussed with Pt/Family/Caregiver: Yes   Freedom of Choice Offered: Yes      DISCHARGE LOCATION   Discharge Placement: Long Term Care

## 2017-02-23 NOTE — Progress Notes (Signed)
Hospitalist Progress Note         [facility]    Daily Progress Note: 02/23/2017    Assessment/Plan:     1. Hypotension: Reported as blood pressure 60/40 in cardiology in the office, patient denies symptoms. Sounds like was recently started on midodrine at [facility] and maybe also Entresto, but reports having been on coreg, digoxin every other day, lisinopril, lasix BID, and metolazone PRN, but sounds like medications were adjusted after recent Ef of 22%.  Currently he is not hypotensive, but most meds were held, and midodrine was continued.  Coreg to resume this AM. Will also plan to resume lasix. But hold off on resuming Lisinopril/Entresto (although would likely benefit from one of these in the long term).  Check orthostatics.    2. 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 Intake CXR reports mild pulm vascular congestion. Will plan to resume lasix, hold off on lisinopril or entresto-differ to cardiology about which/when to resume, but would likely benefit from at some point if BP will tolerate. Salt and fluid restrictions ordered.    3. Chronic hypercapnic respiratory failure with ? Of hypoxia: Hypercapnia appears chronic with VBG here showing elevated CO2 with normal pH, and ABG in [facility] showing similar (pH of 7.44, paCO2 of 57.9).  No acute respiratory symptoms reported. He reports he has intermittently been on O2 at rehab. Currently saturating well at 2L-decreased to 1 and wean to room air as tolerted.    4. Hypothyroidism: TSH of 4.31 at [facility] on 02/09/17.  Recheck tomorrow. Reports has been on 23mg of synthroid in the past, but med list reports 100, will continue 1018m daily for now.    5. Peripheral arterial disease: Reports leg weakness as the reason why he  was in rehab. Continue ASA, Warfarin, Pentoxifylline.  Chronic stasis changes noted to both legs.  Consult PT.    6. Chronic atrial fibrillation, s/p ICD: jContinue Coreg, Digoxin every other day. Check Dig level.  Continue warfarin, pharmacy consulted.     7. Pulmonary hypertension severe per cath in 2010: Also with Echo findings as above.  Continue home lasix, meds as above.    8. Bilateral lymphedema: Due to #2. Continue Lasix, salt and fluid restrictions.    9. 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 Attempt to find balance between heart failure medications and hypotesnion.    10. Hypokalemia: Replete PO Today. Add oral K+ tomorrow with diuretics. Check Mag tomorrow.    11. Hx of Gout: Resume home ulroic, gabapentin. Norco available at 45m46mrn.    12. Warfarin Anticoagulation: INR as above. Pharmacy consulted.    13. Ventral Hernia containing Bowel: Patient reports had been hospitalized with what sounds like concerns for bowel obstruction.  CT from [facility] from Earlier this month reports ventral hernia containing bowel without evidence of obstruction.  He denies any abdominal pain, n/v.  Reports more diarrhea and had been on miralax. He also reports he was supposed to follow up with [facility] as outpt today.  This otherwise does not seem acutely worse. Continue diet as tolerated.    14. GERD: Continue home PPI.    PPX: Warfarin as above.    Dispo: Monitor BP and slowly replace medications for heart failure as BP will tolerate.  F/up cardiac recs. Continue PT. Hypercapnia appears chronic.  Recently d/c'd from [facility], and came from rehab. Anticipate return to rehab after d/c.??  ??  Subjective:     Patient reports feeling in his usual state of health. Denies lightheadedness, Dizziness, SOB, CP, palpitations. Reports leg weakness as why he is in rehab. Also reports intermittent use of O2 at rehab. Reports diarrhea since being on miralax for GI issues.     General ROS: (-) for fever, chills,  Respiratory ROS: (-) for cough, shortness of breath  Cardiovascular ROS: (+) for chronic leg swelling (-) for chest pain, palpitations, Lightheadedness or dizzines.  Gastrointestinal ROS: (+) diarrhea, (-) for abdominal pain, nausea,vomiting  Musculoskeletal ROS: (+) for impaired mobility, arthritis, hx of gout.    Objective:   Physical Exam:     Visit Vitals   ??? BP 110/71   ??? Pulse 89   ??? Temp 97.5 ??F (36.4 ??C)   ??? Resp 19   ??? Ht 6' 1"  (1.854 m)   ??? Wt 144.4 kg (318 lb 6.4 oz)   ??? SpO2 93%   ??? BMI 42.01 kg/m2    O2 Flow Rate (L/min): 2 l/min O2 Device: Nasal cannula    Temp (24hrs), Avg:97.9 ??F (36.6 ??C), Min:97.5 ??F (36.4 ??C), Max:98.5 ??F (36.9 ??C)        09/25 1901 - 09/27 0700  In: 120 [P.O.:120]  Out: 500 [Urine:500]    General: Alert, CM, pleasant to conversation, in no acute distress, On 2L N/C O2.  HEENT: White sclera, Oral mucosa pink  Lymph: No palpable anterior cervical or supraclavicular LAD.  CV: Regular rate and rhythm, no murmurs, rubs, gallops  Pulm: Lungs clear to auscultation bilaterally, no focal wheezes, crackles, rhonchi  GI: Soft, nontender, nondistended, mildly hypoactive bowel sounds  Extremity: No clubbing, cyanosis, B LE Edema with chronic stasis changes with chronic discoloration. Chronic changes c/w long standing gout in the B LE.  Skin: Warm, dry      Data Review:       24 Hour Results:  Recent Results (from the past 24 hour(s))   METABOLIC PANEL, BASIC    Collection Time: 02/22/17  3:00 PM   Result Value Ref Range    Sodium 138 136 - 145 mEq/L    Potassium 3.5 3.5 - 5.1 mEq/L    Chloride 96 (L) 98 - 107 mEq/L    CO2 37 (H) 21 - 32 mEq/L    Glucose 120 (H) 74 - 106 mg/dl    BUN 16 7 - 25 mg/dl    Creatinine 0.9 0.6 - 1.3 mg/dl    GFR est AA >60      GFR est non-AA >60      Calcium 8.6 8.5 - 10.1 mg/dl    Anion gap 5 5 - 15 mmol/L   POC BLOOD GAS    Collection Time: 02/22/17  7:59 PM   Result Value Ref Range    pH 7.342 7.310 - 7.410       PCO2 72.2 (HH) 41.0 - 51.0 mm Hg    PO2 34.0 (L) 35 - 40 mm Hg    BICARBONATE 39.1 (H) 22.0 - 26.0 mmol/L    O2 SAT 58.0 (L) 70 - 75 %    CO2, TOTAL 41 (HH) 21 - 32 mmol/L    BASE EXCESS 13 (H) -2 - 3 mmol/L    Sample type VEN     CBC WITH AUTOMATED DIFF    Collection  Time: 02/22/17  8:20 PM   Result Value Ref Range    WBC 5.8 4.0 - 11.0 1000/mm3    RBC 4.51 3.80 - 5.70 M/uL    HGB 13.0 12.4 - 17.2 gm/dl    HCT 42.8 37.0 - 50.0 %    MCV 94.9 80.0 - 98.0 fL    MCH 28.8 23.0 - 34.6 pg    MCHC 30.4 30.0 - 36.0 gm/dl    PLATELET 185 140 - 450 1000/mm3    MPV 11.0 (H) 6.0 - 10.0 fL    RDW-SD 62.1 (H) 35.1 - 43.9      NRBC 0 0 - 0      IMMATURE GRANULOCYTES 0.2 0.0 - 3.0 %    NEUTROPHILS 66.4 (H) 34 - 64 %    LYMPHOCYTES 24.4 (L) 28 - 48 %    MONOCYTES 7.0 1 - 13 %    EOSINOPHILS 1.5 0 - 5 %    BASOPHILS 0.5 0 - 3 %   NT-PRO BNP    Collection Time: 02/22/17  8:20 PM   Result Value Ref Range    NT pro-BNP 4091.0 (H) 0.0 - 376.2 pg/ml   METABOLIC PANEL, COMPREHENSIVE    Collection Time: 02/22/17  8:20 PM   Result Value Ref Range    Sodium 140 136 - 145 mEq/L    Potassium 3.4 (L) 3.5 - 5.1 mEq/L    Chloride 96 (L) 98 - 107 mEq/L    CO2 38 (H) 21 - 32 mEq/L    Glucose 96 74 - 106 mg/dl    BUN 16 7 - 25 mg/dl    Creatinine 0.9 0.6 - 1.3 mg/dl    GFR est AA >60      GFR est non-AA >60      Calcium 8.4 (L) 8.5 - 10.1 mg/dl    AST (SGOT) 19 15 - 37 U/L    ALT (SGPT) 12 12 - 78 U/L    Alk. phosphatase 113 45 - 117 U/L    Bilirubin, total 1.4 (H) 0.2 - 1.0 mg/dl    Protein, total 6.5 6.4 - 8.2 gm/dl    Albumin 2.2 (L) 3.4 - 5.0 gm/dl    Anion gap 6 5 - 15 mmol/L   LIPASE    Collection Time: 02/22/17  8:20 PM   Result Value Ref Range    Lipase 58 (L) 73 - 393 U/L   PROTHROMBIN TIME + INR    Collection Time: 02/22/17  8:20 PM   Result Value Ref Range    Prothrombin time 33.3 (H) 10.2 - 12.9 seconds    INR 2.8 (H) 0.1 - 1.1     TROPONIN I    Collection Time: 02/22/17 11:13 PM   Result Value Ref Range     Troponin-I 0.024 0.000 - 0.045 ng/ml   TROPONIN I    Collection Time: 02/23/17  1:25 AM   Result Value Ref Range    Troponin-I 0.020 0.000 - 0.045 ng/ml   CBC WITH AUTOMATED DIFF    Collection Time: 02/23/17  3:47 AM   Result Value Ref Range    WBC 5.2 4.0 - 11.0 1000/mm3    RBC 3.98 3.80 - 5.70 M/uL    HGB 11.8 (L) 12.4 - 17.2 gm/dl    HCT 38.0 37.0 - 50.0 %    MCV 95.5 80.0 - 98.0 fL    MCH 29.6 23.0 - 34.6 pg    MCHC 31.1 30.0 - 36.0 gm/dl  PLATELET 148 140 - 450 1000/mm3    MPV 11.4 (H) 6.0 - 10.0 fL    RDW-SD 62.1 (H) 35.1 - 43.9      NRBC 0 0 - 0      IMMATURE GRANULOCYTES 0.2 0.0 - 3.0 %    NEUTROPHILS 64.7 (H) 34 - 64 %    LYMPHOCYTES 23.6 (L) 28 - 48 %    MONOCYTES 8.8 1 - 13 %    EOSINOPHILS 2.3 0 - 5 %    BASOPHILS 0.4 0 - 3 %   METABOLIC PANEL, BASIC    Collection Time: 02/23/17  3:47 AM   Result Value Ref Range    Sodium 142 136 - 145 mEq/L    Potassium 3.2 (L) 3.5 - 5.1 mEq/L    Chloride 98 98 - 107 mEq/L    CO2 37 (H) 21 - 32 mEq/L    Glucose 92 74 - 106 mg/dl    BUN 16 7 - 25 mg/dl    Creatinine 0.9 0.6 - 1.3 mg/dl    GFR est AA >60      GFR est non-AA >60      Calcium 8.0 (L) 8.5 - 10.1 mg/dl    Anion gap 6 5 - 15 mmol/L   TROPONIN I    Collection Time: 02/23/17  3:47 AM   Result Value Ref Range    Troponin-I 0.021 0.000 - 0.045 ng/ml   PROTHROMBIN TIME + INR    Collection Time: 02/23/17  3:47 AM   Result Value Ref Range    Prothrombin time 36.9 (H) 10.2 - 12.9 seconds    INR 3.0 (H) 0.1 - 1.1         Problem List:  Problem List as of 02/23/2017  Date Reviewed: 2012/11/28          Codes Class Noted - Resolved    Acute hypercapnic respiratory failure ([place]) ICD-10-CM: J96.02  ICD-9-CM: 518.81  02/22/2017 - Present        Hyponatremia ICD-10-CM: E87.1  ICD-9-CM: 276.1  09/03/2010 - Present        Hematuria ICD-10-CM: R31.9  ICD-9-CM: 599.70  09/03/2010 - Present        CAD (coronary artery disease), native coronary artery ICD-10-CM: I25.10  ICD-9-CM: 414.01  09/03/2010 - Present         Defibrillator discharge ICD-10-CM: Z45.02  ICD-9-CM: V71.89  09/03/2010 - Present        Cardiomyopathy ([place]) ICD-10-CM: I42.9  ICD-9-CM: 425.4  09/03/2010 - Present        Atrial fibrillation (HCC) ICD-10-CM: I48.91  ICD-9-CM: 427.31  09/03/2010 - Present        LBBB (left bundle branch block) ICD-10-CM: I44.7  ICD-9-CM: 426.3  09/03/2010 - Present        Primary localized osteoarthrosis, hand ICD-10-CM: M19.049  ICD-9-CM: 715.14  09/03/2010 - Present        ICD (implantable cardiac defibrillator) in place ICD-10-CM: Z95.810  ICD-9-CM: V45.02  09/03/2010 - Present        Pulmonary hypertension ([place]) ICD-10-CM: I27.20  ICD-9-CM: 416.8  09/03/2010 - Present        Syncope and collapse ICD-10-CM: R55  ICD-9-CM: 780.2  09/03/2010 - Present        Permanent atrial fibrillation (HCC) ICD-10-CM: I48.2  ICD-9-CM: 427.31  09/03/2010 - Present        HTN (hypertension), benign ICD-10-CM: I10  ICD-9-CM: 401.1  09/03/2010 - Present        Obesity, morbid ([place])  ICD-10-CM: E66.01  ICD-9-CM: 278.01  09/03/2010 - Present        Edema ICD-10-CM: R60.9  ICD-9-CM: 782.3  09/03/2010 - Present        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 furosemide (LASIX) tablet 40 mg  40 mg Oral ACB&D   ??? levothyroxine (SYNTHROID) tablet 75 mcg  75 mcg Oral 6am   ??? [START ON 02/24/2017] potassium chloride (K-DUR, KLOR-CON) SR tablet 20 mEq  20 mEq Oral BID   ??? febuxostat (ULORIC) tablet 40 mg  40 mg Oral DAILY   ??? gabapentin (NEURONTIN) capsule 200 mg  200 mg Oral TID   ??? aspirin delayed-release tablet 81 mg  81 mg Oral DAILY   ??? carvedilol (COREG) tablet 3.125 mg  3.125 mg Oral BID WITH MEALS   ??? digoxin (LANOXIN) tablet 0.125 mg  0.125 mg Oral EVERY OTHER DAY    ??? omeprazole (PRILOSEC) capsule 20 mg  20 mg Oral DAILY   ??? pentoxifylline CR (TRENTAL) tablet 400 mg  400 mg Oral TID WITH MEALS   ??? midodrine (PROAMITINE) tablet 10 mg  10 mg Oral TID WITH MEALS   ??? sodium chloride (NS) flush 5-10 mL  5-10 mL IntraVENous Q8H   ??? sodium chloride (NS) flush 5-10 mL  5-10 mL IntraVENous PRN   ??? naloxone (NARCAN) injection 0.1 mg  0.1 mg IntraVENous PRN   ??? acetaminophen (TYLENOL) tablet 650 mg  650 mg Oral Q6H PRN   ??? HYDROcodone-acetaminophen (NORCO) 5-325 mg per tablet 1 Tab  1 Tab Oral Q4H PRN   ??? morphine injection 1 mg  1 mg IntraVENous Q4H PRN   ??? alum-mag hydroxide-simeth (MYLANTA) oral suspension 30 mL  30 mL Oral Q4H PRN   ??? albuterol (PROVENTIL VENTOLIN) nebulizer solution 2.5 mg  2.5 mg Nebulization Q6H PRN   ??? [START ON 02/24/2017] warfarin (COUMADIN) tablet 3 mg  3 mg Oral Once per day on Mon Wed Fri   ??? *Warfarin Pharmacy to Dose  1 Each Other Rx Dosing/Monitoring   ??? warfarin (COUMADIN) tablet 4 mg  4 mg Oral Once per day on Sun Tue Thu Sat        Care Plan discussed with: Patient/Family, Nurse and Case Manager    Total time spent with patient: 36 minutes.    Marzella Schlein, MD  February 23, 2017  09:18

## 2017-02-23 NOTE — Progress Notes (Signed)
Problem: Pressure Injury - Risk of  Goal: *Prevention of pressure injury  Document Braden Scale and appropriate interventions in the flowsheet.   Outcome: Progressing Towards Goal  Pressure Injury Interventions:  Sensory Interventions: Assess changes in LOC, Check visual cues for pain, Discuss PT/OT consult with provider, Float heels, Keep linens dry and wrinkle-free, Minimize linen layers, Pad between skin to skin, Pressure redistribution bed/mattress (bed type), Turn and reposition approx. every two hours (pillows and wedges if needed), Use 30-degree side-lying position    Moisture Interventions: Apply protective barrier, creams and emollients, Absorbent underpads, Check for incontinence Q2 hours and as needed, Maintain skin hydration (lotion/cream), Limit adult briefs, Moisture barrier, Offer toileting Q_hr    Activity Interventions: PT/OT evaluation, Pressure redistribution bed/mattress(bed type)    Mobility Interventions: Assess need for specialty bed, Float heels, HOB 30 degrees or less, PT/OT evaluation, Pressure redistribution bed/mattress (bed type), Turn and reposition approx. every two hours(pillow and wedges)    Nutrition Interventions: Document food/fluid/supplement intake, Discuss nutritional consult with provider, Offer support with meals,snacks and hydration    Friction and Shear Interventions: Apply protective barrier, creams and emollients, Feet elevated on foot rest, Foam dressings/transparent film/skin sealants, HOB 30 degrees or less

## 2017-02-23 NOTE — Progress Notes (Addendum)
NUTRITION RECOMMENDATIONS:   ?? Unjury TID to promote wound healing (Supplements provide a total of 270-300 kcal, 63 gms protein)  ?? Consider Vitamin C and Zinc to enhance wound healing (dosage adjusted for obesity, also take into account that vitamin C is water soluble and pt is currently on lasix)  ?? Consider MVI if PO intake <75%    NUTRITION INITIAL EVALUATION    NUTRITION ASSESSMENT:       Reason for assessment: PUR     Admitting diagnosis: Acute hypercapnic respiratory failure (HCC)      PMH:  Past Medical History:   Diagnosis Date   ??? Arthritis    ??? Arthropathy, unspecified, site unspecified    ??? Automatic implantable cardiac defibrillator in situ    ??? Cardiac arrhythmia    ??? CHF (congestive heart failure) (HCC)    ??? Heart disease    ??? Hernia    ??? Irregular heart beat    ??? Kidney stone    ??? Lymphedema    ??? Nerve pain    ??? Pacemaker    ??? Pulmonary hypertension (HCC)    ??? Renal disorder    ??? Renal failure, acute (HCC)    ??? UTI (urinary tract infection)    ??? Varicose veins of lower extremities with ulcer (HCC)    ??? Vision decreased        Code Status: Full Code      Anthropometrics:  Height: Ht Readings from Last 3 Encounters:   02/22/17 6\' 1"  (1.854 m)   11/12/12 6' (1.829 m)   09/29/11 6' (1.829 m) ??       Weight: Wt Readings from Last 3 Encounters:   02/22/17 144.4 kg (318 lb 6.4 oz)   11/12/12 147 kg (324 lb)   09/29/11 147 kg (324 lb) ??       ?? IBW: 84 kg     ?? % IBW: 172%    ?? BMI: Body mass index is 42.01 kg/(m^2).    ?? UBW: unable to obtain d/t pt sleeping-no family at bedside.    ?? Wt change: no wt changes noted in wt hx, unable to obtain additional info d/t pt sleeping-no family at bedside.   Diet and intake history:  ?? Current diet order: DIET CARDIAC Soft Solids; 2 GM NA (House Low NA); FR 2000ML    ?? Food allergies: NKFA    ?? Diet/intake history: unable to obtain d/t pt sleeping-no family at bedside.    [ ]  <50% intake x >5 days  [ ]  <50% intake x >1 month  [ ]  <75% intake x >7 days   [ ]  <75% intake x 1 month  [ ]  <75% intake x 3 months    ?? Current appetite/PO intake: 25% x 1-meal     Assessment of current MNT: Diet is adequate if intake averages 75% at most meals - recommend adding oral supplement TID, to increase protein -calorie intake opportunity.     ?? Cultural, religious, and ethnic food preferences identified: n/a    Physical Assessment:  ?? GI symptoms: Abd obese. LBM 9/27; active BS. H/o of ventral hernia w/out obstruction    ?? Chewing/swallowing issues: none noted    ?? Skin integrity: stage III of sacral and stage II of posterior thigh-wound eval pending    ?? Muscle wasting: mild temporal depletion per visual inspection-unable to assess fully d/t pt sleeping-will attempt to follow-up as schedule allows.    ?? Fat wasting: unable to assess  d/t pr sleeping-will attempt to follow-up as schedule allows.    ?? Fluid accumulation: +2 BLE    ?? Mental status: sleeping at time of visit    ?? Other: gout on BLE Intake and output:    Intake/Output Summary (Last 24 hours) at 02/23/17 1309  Last data filed at 02/23/17 1227   Gross per 24 hour   Intake              480 ml   Output              750 ml   Net             -270 ml       Estimated daily nutrition intake needs:  ?? 2100 - 2520 kcals (25-30 kcal/kg IBW)    ?? ~125- 170 g protein (1.5-2.0 g/kg IBW)    ?? 2000 ml fluid (per MD)     Living situation: unable to obtain d/t pt sleeping-no family at bedside.    Current pertinent medications: prilosec, KCI, coumadin, uloric, lasix, lasix, synthroid    Pertinent labs: 9/27 K+ 3.2-KCI    Does patient meet ASPEN/AND criteria for malnutrition diagnosis: not enough information available to assess for malnutrition    NUTRITION DIAGNOSIS:     1. Increased protein needs related to wound healing as evidenced by stage III sacral and stage II posterior thigh.    2. Decreased sodium and fluid needs related to CHF as evidenced by EF 27%, +2 BLE, need for lasix therapy and 2000 mL fluid restriction.     NUTRITION INTERVENTION:     Recommended diet: Continue current MNT    Recommended nutrition supplement: Unjury TID (90-100 kcal, 21 gms protein per 27g packet)    NUTRITION MONITORING AND EVALUATION:     Nutrition level of care: moderate    Nutrition monitoring: PO intake, nutrition-related labs, wt, medical change of condition, rate of wound healing    Nutrition goals:  PO intake >75%, nutrition-related labs WNL, wt + 2 kg of 144.4 kg over LOS, appropriate rate of wound healing    NUTRITION EDUCATION:     Comments: not able to give diet edu at this time. Will attempt to follow-up as schedule allows.    Theophilus Bones, RD   Pager # [phone]  02/23/17

## 2017-02-23 NOTE — Progress Notes (Addendum)
0700: Bedside shift change report given to Melanni Benway,RN (oncoming nurse) by Deanne Coffer (offgoing nurse). Report included the following information SBAR, Kardex, Intake/Output, Recent Results and Cardiac Rhythm vpaced.   0800: Dr.Camarato in to see the patient.questioned,  Verified, OK to give midodrine and coreg together  1100: BP noted73/ 33, called Dr. Jerl Mina, Dr. Cleon Gustin informed aswell, will give 250cc NS bolus, cont to monitor for now, questioned pressors or higher level of care, continue to monitor, will hold coreg if SBP less 100, fold lasix if SBP less90  1500: wound nurse in to see pt  1900: Bedside shift change report given to Ana,RN (oncoming nurse) by Wyline Beady (offgoing nurse). Report included the following information SBAR, Kardex, Recent Results and Cardiac Rhythm paced

## 2017-02-23 NOTE — Progress Notes (Signed)
Pharmacist note:  Warfarin dosing service:     INR today : 2.8->3   Goal INR : 2-3, check daily  Warfarin : 1 mg x 1 dose today  Pharmacy will follow.  Thank you,     Evie LacksMia Kwabia, Pharm D.

## 2017-02-24 LAB — METABOLIC PANEL, BASIC
Anion gap: 6 mmol/L (ref 5–15)
BUN: 13 mg/dl (ref 7–25)
CO2: 37 mEq/L — ABNORMAL HIGH (ref 21–32)
Calcium: 8.3 mg/dl — ABNORMAL LOW (ref 8.5–10.1)
Chloride: 98 mEq/L (ref 98–107)
Creatinine: 0.8 mg/dl (ref 0.6–1.3)
GFR est AA: >60
GFR est non-AA: >60
Glucose: 91 mg/dl (ref 74–106)
Potassium: 3.7 mEq/L (ref 3.5–5.1)
Sodium: 140 mEq/L (ref 136–145)

## 2017-02-24 LAB — PROTHROMBIN TIME + INR
INR: 2.9 — ABNORMAL HIGH (ref 0.1–1.1)
Prothrombin time: 35.4 seconds — ABNORMAL HIGH (ref 10.2–12.9)

## 2017-02-24 LAB — TSH 3RD GENERATION: TSH: 2.7 u[IU]/mL (ref 0.358–3.740)

## 2017-02-24 LAB — MAGNESIUM: Magnesium: 2.1 mg/dl (ref 1.6–2.6)

## 2017-02-24 MED ORDER — FUROSEMIDE 40 MG TAB
40 mg | Freq: Every day | ORAL | Status: DC
Start: 2017-02-24 — End: 2017-02-25
  Administered 2017-02-25: 14:00:00 via ORAL

## 2017-02-24 MED ORDER — WARFARIN 1 MG TAB NON-SPLITTABLE
1 mg | Freq: Once | ORAL | Status: DC
Start: 2017-02-24 — End: 2017-02-24

## 2017-02-24 MED ORDER — METOPROLOL SUCCINATE SR 25 MG 24 HR TAB
25 mg | Freq: Every day | ORAL | Status: DC
Start: 2017-02-24 — End: 2017-02-25
  Administered 2017-02-24 – 2017-02-25 (×2): via ORAL

## 2017-02-24 MED ORDER — POLYETHYLENE GLYCOL 3350 17 GRAM (100 %) ORAL POWDER PACKET
17 gram | Freq: Every day | ORAL | Status: DC | PRN
Start: 2017-02-24 — End: 2017-03-01

## 2017-02-24 MED FILL — MIDODRINE 5 MG TAB: 5 mg | ORAL | Qty: 2

## 2017-02-24 MED FILL — GABAPENTIN 100 MG CAP: 100 mg | ORAL | Qty: 2

## 2017-02-24 MED FILL — CARVEDILOL 3.125 MG TAB: 3.125 mg | ORAL | Qty: 1

## 2017-02-24 MED FILL — BD POSIFLUSH NORMAL SALINE 0.9 % INJECTION SYRINGE: INTRAMUSCULAR | Qty: 10

## 2017-02-24 MED FILL — PENTOXIFYLLINE SR 400 MG TAB: 400 mg | ORAL | Qty: 1

## 2017-02-24 MED FILL — FUROSEMIDE 20 MG TAB: 20 mg | ORAL | Qty: 1

## 2017-02-24 MED FILL — ULORIC 40 MG TABLET: 40 mg | ORAL | Qty: 1

## 2017-02-24 MED FILL — POTASSIUM CHLORIDE SR 20 MEQ TAB, PARTICLES/CRYSTALS: 20 mEq | ORAL | Qty: 1

## 2017-02-24 MED FILL — ASPIRIN 81 MG TAB, DELAYED RELEASE: 81 mg | ORAL | Qty: 1

## 2017-02-24 MED FILL — COUMADIN 1 MG TABLET: 1 mg | ORAL | Qty: 1

## 2017-02-24 MED FILL — OMEPRAZOLE 20 MG CAP, DELAYED RELEASE: 20 mg | ORAL | Qty: 1

## 2017-02-24 MED FILL — SYNTHROID 100 MCG TABLET: 100 mcg | ORAL | Qty: 1

## 2017-02-24 MED FILL — METOPROLOL SUCCINATE SR 25 MG 24 HR TAB: 25 mg | ORAL | Qty: 1

## 2017-02-24 NOTE — Progress Notes (Addendum)
Patient admitted on 02/22/2017 from [facility] with   Chief Complaint   Patient presents with   ??? Hypotension        The patient has been admitted to the hospital 0 times in the past 12 months.    Updated note CM spoke with liason for [facility] and requested to start authorization.    Tentative dc plan:  Reassessment- SNF with a transition to LTC. Prior to admission patient was at [facility] from Aug-Sept initially lives with sister but CM spoke with patient sister and she will be unable to care for patient fully and prefer for a SNF-preference is [facility] but they have declined spoke with patient sister and patient and informed that [facility] has a SNF and LTC bed available-auth will be needed with [employer].     Facility if plan pending     Anticipated Discharge Date:   09/29-09/30    PCP: Ozella Rocks, DO     Specialists:     Cardiology, gastro, pain management-sister was transporting to appointments     Face sheet information, address, contact info and insurance verified  yes    Dialysis Unit/ chair time / access:    No     Pharmacy:     [facility]  Any issues getting medications no    DME at home and provider:    Dan Humphreys and wheelchair    O2 at home  no provider no    Home Environment:    Lives at [address]  [place] [address]    [phone].     Prior to admission open services:     no    Home Health Agency-     no    Personal Care Agency-    no    Extended Emergency Contact Information  Primary Emergency Contact: Renee Rival  Home Phone: [phone]  Relation: Other Relative      Transportation:     CM will setup transport     Therapy Recommendations:  OT :y/n    Discharge Recommendations: SNF.  Further Equipment Recommendations for Discharge: if DC'd home - hoyer lift, wide BSC.  Patient feels his w/c at home fits.  PT :y/n     Discharge Recommendations: SNF, Home with home health PT and TBD, pending progress  SLP :y/n    no     RT Home O2 Evaluation :y/n     no    Wound Care: y/n     Yes stage 3 and stage 2 POA    Change consult (formerly chamberlain) :    no    Case Management Assessment    ABUSE/NEGLECT SCREENING   Physical Abuse/Neglect: Denies   Sexual Abuse: Denies   Sexual Abuse: Denies   Other Abuse/Issues: Denies          PRIMARY DECISION MAKER                                   CARE MANAGEMENT INTERVENTIONS   Readmission Interview Completed: Not Applicable               Mode of Transport at Discharge: BLS       Transition of Care Consult (CM Consult): Long Term Care               Discharge Durable Medical Equipment: Yes (walker, wheelchair, bedside  commode, and shower chair in the home)               Current Support Network: Other (living with elederly sister prior to being admitted to rehab in August)   Reason for Referral: DCP Rounds   History Provided By: Patient, Child/Family   Patient Orientation: Alert and Oriented, Person, Place, Situation, Self   Cognition: Alert   Support System Response: Concerned, Cooperative   Previous Living Arrangement: Lives with Family Dependent   Home Accessibility: Ramp   Prior Functional Level: Assistance with the following:, Bathing, Dressing, Toileting, Cooking, Housework, Shopping, Mobility   Current Functional Level: Assistance with the following:, Bathing, Dressing, Toileting, Cooking, Housework, Shopping, Mobility   Primary Language: English   Can patient return to prior living arrangement: No   Ability to make needs known:: Good   Family able to assist with home care needs:: No   Pets: None               Anticipated Discharge Needs: Skilled Nursing Facility   Confirm Follow Up Transport: Other (see comment) (medical transport)   Confirm Transport and Arrange: Yes   Plan discussed with Pt/Family/Caregiver: Yes   Freedom of Choice Offered: Yes      DISCHARGE LOCATION   Discharge Placement: Skilled nursing facility

## 2017-02-24 NOTE — Progress Notes (Addendum)
To return to PLOF:  Pt will be S with all bed mobility  Pt with be S with all transfers,  Pt will ambulate x10' with LRAD and S  Pt will increase B LE strength by 1/2 grade  Pt will be S with HEP  physical Therapy Evaluation andtreatment    Patient: [name] (71 y.o. male)  Room: CD02/CD02    Date: 02/24/2017  Start Time:  1040  End Time:  1100    Primary Diagnosis: Acute hypercapnic respiratory failure (HCC)          Precautions: Falls.  Weight bearing precautions: None      ASSESSMENT :  Based on the objective data described below, the patient presents with  Able to participate with LE exercises in bed with 1 person assist  Generalized muscle weakness affecting function   Decreased Strength  Decreased ADL/Functional Activities  Decreased Transfer Abilities  Decreased Ambulation Ability/Technique  Decreased Activity Tolerance.      Patient???s rehabilitation potential is considered to be Good     Recommendations:  Physical Therapy  Discharge Recommendations: SNF, Home with home health PT and TBD, pending progress  Further Equipment Recommendations for Discharge: TBD        PLAN :  Planned Interventions:  Functional mobility training Gait Training Stair training Balance Training Therapeutic exercises Therapeutic activities Neuro muscular re-education AD training Patient/caregiver education      Frequency/Duration: Patient will be followed by physical therapy 3x / Week to address goals.                 Orders reviewed, chart reviewed on [name].    SUBJECTIVE:   Patient: agreed to PT    OBJECTIVE DATA SUMMARY:   Present illness history:   Problem List  Date Reviewed: 11/15/12          Codes Class Noted    Acute hypercapnic respiratory failure (HCC) ICD-10-CM: J96.02  ICD-9-CM: 518.81  02/22/2017        Hyponatremia ICD-10-CM: E87.1  ICD-9-CM: 276.1  09/03/2010        Hematuria ICD-10-CM: R31.9  ICD-9-CM: 599.70  09/03/2010        CAD (coronary artery disease), native coronary artery ICD-10-CM: I25.10   ICD-9-CM: 414.01  09/03/2010        Defibrillator discharge ICD-10-CM: Z45.02  ICD-9-CM: V71.89  09/03/2010        Cardiomyopathy (HCC) ICD-10-CM: I42.9  ICD-9-CM: 425.4  09/03/2010        Atrial fibrillation (HCC) ICD-10-CM: I48.91  ICD-9-CM: 427.31  09/03/2010        LBBB (left bundle branch block) ICD-10-CM: I44.7  ICD-9-CM: 426.3  09/03/2010        Primary localized osteoarthrosis, hand ICD-10-CM: M19.049  ICD-9-CM: 715.14  09/03/2010        ICD (implantable cardiac defibrillator) in place ICD-10-CM: Z95.810  ICD-9-CM: V45.02  09/03/2010        Pulmonary hypertension (HCC) ICD-10-CM: I27.20  ICD-9-CM: 416.8  09/03/2010        Syncope and collapse ICD-10-CM: R55  ICD-9-CM: 780.2  09/03/2010        Permanent atrial fibrillation (HCC) ICD-10-CM: I48.2  ICD-9-CM: 427.31  09/03/2010        HTN (hypertension), benign ICD-10-CM: I10  ICD-9-CM: 401.1  09/03/2010        Obesity, morbid (HCC) ICD-10-CM: E66.01  ICD-9-CM: 278.01  09/03/2010        Edema ICD-10-CM: R60.9  ICD-9-CM: 782.3  09/03/2010  Varicose veins of lower extremities with ulcer (HCC) ICD-10-CM: I83.009, L97.909  ICD-9-CM: 454.0  09/03/2010        Lymphedema ICD-10-CM: I89.0  ICD-9-CM: 457.1  09/03/2010             Past Medical history:   Past Medical History:   Diagnosis Date   ??? Arthritis    ??? Arthropathy, unspecified, site unspecified    ??? Automatic implantable cardiac defibrillator in situ    ??? Cardiac arrhythmia    ??? CHF (congestive heart failure) (HCC)    ??? Heart disease    ??? Hernia    ??? Irregular heart beat    ??? Kidney stone    ??? Lymphedema    ??? Nerve pain    ??? Pacemaker    ??? Pulmonary hypertension (HCC)    ??? Renal disorder    ??? Renal failure, acute (HCC)    ??? S/P IVC filter    ??? UTI (urinary tract infection)    ??? Varicose veins of lower extremities with ulcer (HCC)    ??? Vision decreased        Prior Level of Function/Home Situation:   Home environment: Lives with Family, 1 story, Bedroom 1st floor, Ramp.   Prior level of function: was amb in hom eshort distance and usesd w/c for longer distance x1 mo ago, went to rehab and has not walked since  Home equipment: rolling walker and wheel chair      Patient found: Bed and IV.    Pain Assessment before PT session: None reported  Pain Location:   Pain Assessment after PT session: None reported  Pain Location:    []           Yes, patient had pain medications  []           No, Patient has not had pain medications  []           Nurse notified    COGNITIVE STATUS:     Mental Status: Oriented x3.  Communication: normal.  Follows commands: intact.  General Cognition: intact .  Hearing: grossly intact.  Vision:  grossly intact.      EXTREMITIES ASSESSMENT:      Strength:    Grossly 3+/5 B LE      Range Of Motion:  WFL throughout except limited by girth      Functional mobility and balance status:     Supine to sit -  contact guard and min. assist  Sit to Supine -  contact guard  longsitting in bed, did not attempt EOB today      Balance:   Static Sitting Balance -  good-          Therapeutic Exercises:    Patient received/participated in 10 minutes of treatment (therapeutic exercises/activities) immediately following evaluation and/or educational instruction during/immediately following PT evaluation.    Lower Extremities:  Supine, Glut Set, Quad Set, Hamstring Set, Heel Slide, Short arc quad, Straight leg raise, Hip ab/duction, Ankle pumps, BLE, Assisted and 10 reps          Activity Tolerance:   fair    Final Location:   bed, all needs close, agrees to call for assistance and nurse notified     COMMUNICATION/EDUCATION:   Education: Patient, Benefit of activity while hospitalized, Call for assistance, Out of bed 2-3 times/day, Staff assistance with mobility, Changes positions frequently, Sit out of bed for 45-60 minutes or as tolerated, Safety, Functional mobility, Demonstrates adequately, Verbalized understanding, Teaching method,  Verbal and Role of PT   Barriers to Learning/Limitations: None    Please refer to care plan and patient education section for further details.    Thank you for this referral.  Luan Pulling, PT, DPT  Pager [phone]

## 2017-02-24 NOTE — Progress Notes (Signed)
[facility] Dosing Services: Warfarin    INR today = 2.9. INR within goal of 2-3. Pt received coumadin 1 mg yesterday. Will give coumadin 1 mg tonight. Pharmacy will follow INR in AM.    Thank you,  Ola Spurr, PharmD

## 2017-02-24 NOTE — Other (Signed)
Bedside shift change report given to Timothy, RN (oncoming nurse) by Ana, RN (offgoing nurse). Report included the following information SBAR, Kardex, Intake/Output, MAR and Recent Results.

## 2017-02-24 NOTE — Progress Notes (Signed)
CARDIOLOGY PROGRESS NOTE     Assessment:   1.  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)       - ECHO 01/12/17: EF 22% with severely decreased LV function, grade III DD, mod-severe basal to mid A/L + I/L wall segments, severely dilated LA, dilated RH with normal systolic function, severe MR, mod TR and RVSP 56 mmHG  2.  Non-obstructive CAD   3.  H/o VT   4.  AF - chronic permanent  5.  Obesity   6.  Hypotension    Plan:  - Transition from Coreg to Toprol XL 25 mg (less vasoactive) given ongoing hypotension  - Midodrine being used to support BP  - Reasonable to continue digoxin for now  - Change Lasix to 40 mg daily  - Previously on Entresto + Metolazone, currently being held  - D/c VKA (labile INRs).  Plan to start Eliquis once INR drifts close to 2.  - Encourage [name]/OT    Following along.  Please call with any immediate questions/concerns. Family updated at the bedside.     Zadie CleverlyM. Lucas Nekeshia Lenhardt, DO   Pager [phone]   [facility]   ===========================================================   HPI:   [name] is a 71 y.o. male with   Patient Active Problem List   Diagnosis Code   ??? Hyponatremia E87.1   ??? Hematuria R31.9   ??? CAD (coronary artery disease), native coronary artery I25.10   ??? Defibrillator discharge Z45.02   ??? Cardiomyopathy (HCC) I42.9   ??? Atrial fibrillation (HCC) I48.91   ??? LBBB (left bundle branch block) I44.7   ??? Primary localized osteoarthrosis, hand M19.049   ??? ICD (implantable cardiac defibrillator) in place Z95.810   ??? Pulmonary hypertension (HCC) I27.20   ??? Syncope and collapse R55   ??? Permanent atrial fibrillation (HCC) I48.2   ??? HTN (hypertension), benign I10   ??? Obesity, morbid (HCC) E66.01   ??? Edema R60.9   ??? Varicose veins of lower extremities with ulcer (HCC) I83.009, L97.909   ??? Lymphedema I89.0   ??? Acute hypercapnic respiratory failure (HCC) J96.02     Subjective:    No acute overnight events.  [name] feeling better subjectively overall. Denies angina or dyspnea.       TELE:  Underlying AF + V-pacing  (personally reviewed)    VS:   Visit Vitals   ??? BP 92/56   ??? Pulse 81   ??? Temp 97.8 ??F (36.6 ??C)   ??? Resp 22   ??? Ht 6\' 1"  (1.854 m)   ??? Wt 144.4 kg (318 lb 6.4 oz)   ??? SpO2 98%   ??? BMI 42.01 kg/m2      Intake/Output Summary (Last 24 hours) at 02/24/17 1135  Last data filed at 02/24/17 0817   Gross per 24 hour   Intake              610 ml   Output              800 ml   Net             -190 ml      GEN: Awake/Alert/Oriented x 4. NAD. Non-toxic   HEENT: EOMI, non-icteric sclera, no pallor   NECK: thick, no appreciable JVD, negative carotid bruits   CV: Regular distant s1s2, no s3, 2/6 mid-late systolic murmur  PULM:  Non-labored BS, diminished bibasilar BS (effort/habitus), no  wheezes or rales   ABD: Soft, Nontender, + BS, obese & benign   EXT: Warm, well perfused, + 1-2 pitting BLE edema, distal pulses intact   Neuro: Grossly non-focal   SKIN: Warm, Dry, Intact   PSYCH:  Insight/judgment - intact    No results found for this or any previous visit (from the past 8 hour(s)).    Current Facility-Administered Medications   Medication Dose Route Frequency   ??? potassium chloride (K-DUR, KLOR-CON) SR tablet 20 mEq  20 mEq Oral BID   ??? febuxostat (ULORIC) tablet 40 mg  40 mg Oral DAILY   ??? gabapentin (NEURONTIN) capsule 200 mg  200 mg Oral TID   ??? levothyroxine (SYNTHROID) tablet 100 mcg  100 mcg Oral 6am   ??? furosemide (LASIX) tablet 20 mg  20 mg Oral ACB&D   ??? aspirin delayed-release tablet 81 mg  81 mg Oral DAILY   ??? carvedilol (COREG) tablet 3.125 mg  3.125 mg Oral BID WITH MEALS   ??? digoxin (LANOXIN) tablet 0.125 mg  0.125 mg Oral EVERY OTHER DAY   ??? omeprazole (PRILOSEC) capsule 20 mg  20 mg Oral DAILY   ??? pentoxifylline CR (TRENTAL) tablet 400 mg  400 mg Oral TID WITH MEALS   ??? midodrine (PROAMITINE) tablet 10 mg  10 mg Oral TID WITH MEALS    ??? sodium chloride (NS) flush 5-10 mL  5-10 mL IntraVENous Q8H   ??? sodium chloride (NS) flush 5-10 mL  5-10 mL IntraVENous PRN   ??? naloxone (NARCAN) injection 0.1 mg  0.1 mg IntraVENous PRN   ??? acetaminophen (TYLENOL) tablet 650 mg  650 mg Oral Q6H PRN   ??? HYDROcodone-acetaminophen (NORCO) 5-325 mg per tablet 1 Tab  1 Tab Oral Q4H PRN   ??? morphine injection 1 mg  1 mg IntraVENous Q4H PRN   ??? alum-mag hydroxide-simeth (MYLANTA) oral suspension 30 mL  30 mL Oral Q4H PRN   ??? albuterol (PROVENTIL VENTOLIN) nebulizer solution 2.5 mg  2.5 mg Nebulization Q6H PRN   ??? *Warfarin Pharmacy to Dose  1 Each Other Rx Dosing/Monitoring

## 2017-02-24 NOTE — Progress Notes (Signed)
Problem: Self Care Deficits Care Plan (Adult)  Goal: Interventions  OT goals initiated 02/24/2017 and will be met by patient within 10 sessions.    -  Patient will be Moderate Assistance with lower body ADLs using adaptive equipment and/or DME as needed.  -  Patient will perform toileting tasks with Moderate Assistance with Fair safety to reduce falls risk.  -  Patient and/or cargegiver will be instructed in bathroom and home safety, and AE and/or DME options for ADLs, as needed.  Patient and/or caregiver will verbalize understanding.  -  Patient will be Minimum assistance with move from supine to sit and sit to supine , scoot up and down and roll side to side in bed.    -  Patient will perform dynamic sitting balance activities for improved ADL/IADL function with Supervision and good balance.  -  Patient will perform 10 repetitions of active or active/assist range of motion exercises for bilateral upper extremity(s) for maintenance of ROM, strength and skin integrity.     OCCUPATIONAL THERAPY EVALUATION     Patient: [name] (71 y.o. male)  Room: CD02/CD02    Date: 02/24/2017  Start Time:  1020        End Time:  1037    Primary Diagnosis: Acute hypercapnic respiratory failure ([place])        Admit date: 02/22/2017  Insurance: Payor: [employer] MEDICARE / Plan: [place] HMO / Product Type: Managed Care Medicare /      Precautions: Falls.      Ordered Weight Bearing Status: None      ASSESSMENT :  Based on the objective data described below, the patient presents with    - generalized muscle weakness affecting function in ADLs  - decreased balance    - decreased functional mobility   - decreased activity tolerance, increased fatigue and/or shortness of breathe   - increased pain   affecting patient's safety and independence/ability to perform basic ADLs/IADLs    Patient will benefit from skilled occupational therapy intervention to address the above impairments.      Patient???s rehabilitation potential is considered to be Fair for above stated goals.      PLAN:  Patient will be followed by occupational therapy to address goals while hospitalized as patient's status and schedule permit.  Planned Interventions: Adaptive equipment, ADI training, activity tolerance, functional balance training, functional mobility training, therapeutic exercise, therapeutic activity, patient/caregiver education and training, home exercise program and neuromuscular re-education.  Discharge Recommendations: SNF.  Further Equipment Recommendations for Discharge: if [place] home - hoyer lift, wide BSC.  Patient feels his w/c at home fits.    Recommend out of bed activity to counteract ill effects of bedrest, with assistance from staff as needed.     Please refer to Patient Education and Care Plan sections of chart for additional details.    EDUCATION:     Barriers to Learning/Limitations:  None  Education provided patient on  Role of OT    SUBJECTIVE:     Patient agreed to OT    OBJECTIVE DATA SUMMARY:     Orders, labs, and chart reviewed on [name]. Discussed with Tim, Therapist, sports.    Last O2 entry in flow sheets:  O2 Device: Nasal cannula (02/24/17 0817)  O2 Flow Rate (L/min): 2 l/min (02/24/17 0817)    Patient was admitted to the hospital on 02/22/2017 with   Chief Complaint   Patient presents with   ??? Hypotension  Present illness history:   Patient Active Problem List    Diagnosis Date Noted   ??? Acute hypercapnic respiratory failure ([place]) 02/22/2017   ??? Hyponatremia 09/03/2010   ??? Hematuria 09/03/2010   ??? CAD (coronary artery disease), native coronary artery 09/03/2010   ??? Defibrillator discharge 09/03/2010   ??? Cardiomyopathy ([place]) 09/03/2010   ??? Atrial fibrillation ([place]) 09/03/2010   ??? LBBB (left bundle branch block) 09/03/2010   ??? Primary localized osteoarthrosis, hand 09/03/2010   ??? ICD (implantable cardiac defibrillator) in place 09/03/2010   ??? Pulmonary hypertension ([place]) 09/03/2010    ??? Syncope and collapse 09/03/2010   ??? Permanent atrial fibrillation ([place]) 09/03/2010   ??? HTN (hypertension), benign 09/03/2010   ??? Obesity, morbid ([place]) 09/03/2010   ??? Edema 09/03/2010   ??? 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 Lymphedema 09/03/2010      Previous medical history:   Past Medical History:   Diagnosis Date   ??? Arthritis    ??? Arthropathy, unspecified, site unspecified    ??? Automatic implantable cardiac defibrillator in situ    ??? Cardiac arrhythmia    ??? CHF (congestive heart failure) ([place])    ??? Heart disease    ??? Hernia    ??? Irregular heart beat    ??? Kidney stone    ??? Lymphedema    ??? Nerve pain    ??? Pacemaker    ??? Pulmonary hypertension ([place])    ??? Renal disorder    ??? Renal failure, acute ([place])    ??? S/P IVC filter    ??? UTI (urinary tract infection)    ??? 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 Vision decreased        Patient found: Bed, ICU/stepdown equip and Oxygen.     Pain Assessment: Not rated  Pain Location:  bottom    PRIOR LEVEL OF FUNCTION / LIVING SITUATION:     Information was obtained by:   patient  Home environment:   Lived with sister   ?? Assistance available following hospital stay:  Yes: sibling(s)  Prior level of function: ADLs with assist and w/c bound  Prior level of Instrumental Activities of Daily Living:   Does not perform  Home equipment: Walker, W/C, BSC     Comment:  At home patient stayed in recliner with [facility]  nearby for toileting. Patient had a liner in toilet to allow him to empty via wrapping in bad.  Patient had w/c for mobility. Patient was able to stand to transfer to w/c, BSC and recliner.   Patient reports was able to get in/out of car for doctor visits.  On last outing with sister patient was unable to stand and was taken to [facility] then transferred to rehab.  Patient reports in rehab was hoyer lift or slide board transfers out of bed.  Patient occasionally worked on standing in therapy department but had not  been very mobile since hospitalization.    COGNITIVE STATUS:     Mental Status:   Oriented to, person, place, month, year, situation, alert and awake  Communication:   grossly intact  Attention Span:   good(>29mn)  Follows Commands:   intact and 2 step    KAOZ:HYQMVHQIONlevels   7 Complete Independence (timely, safely)                           6 Modified Independence (extra  time, devices, mild safety issues)                   HELPER is used for levels 5-1                                                  3 Moderate Assist (performs 50%-74% of task)   5 Supervision (cuing, coaxing, prompting, SET-UP)                   2 Maximal Assist (performs 25% to 49% of task)  4 Minimal Assist/CGA (performs 75% or more of task)               1 Total Assist (performs less than 25% of task)     ACTIVITIES OF DAILY LIVING STATUS:     Based on direct observation, simulation and clinical assessment.  (clincial judgement based on functional mobility)    Eating -  supervision, set-up and bed level  Grooming -  supervision, set-up and EOB level  UB Bathing -  min. assist, set-up and bed level  LB Bathing -  max. assist, set-up, bed level and sponge bath  UB Dressing -  min. assist, set-up and hospital gown  LB Dressing -  dependent  Toileting -  dependent    FUNCTIONAL MOBILITY and BALANCE STATUS:     Mobility:  Not tested, defer to physical therapy.     Transfers:  Not tested, defer to physical therapy.    Balance:  Not tested, defer to physical therapy.     Activity Tolerance: poor+.    UPPER EXTREMITY STATUS:     Dominance:right  RIGHT: active range of motion is [facility].  Strength is grossly graded as 3+/5.  Comment: no c/o paresthesia  LEFT:   active range of motion is WFL, excludes and shoulder ~ 50.  Strength is grossly graded as 3/5 and 3-/5.  Comment: no c/o paresthesia    Final position: bed, all needs close and agrees to call for assistance.     Evaluation Complexity: History: HIGH Complexity : Extensive review of  history including physical, cognitive and psychosocial history ; Examination: HIGH Complexity : 5 or more performance deficits relating to physical, cognitive , or psychosocial skils that result in activity limitations and / or participation restrictions; Decision Making:HIGH Complexity : Patient presents with comorbidities that affect occupational performance. Signifigant modification of tasks or assistance (eg, physical or verbal) with assessment (s) is necessary to enable patient to complete evaluation     Thank you for this referral.  Leighton Ruff, OTR/L  [phone]

## 2017-02-24 NOTE — Progress Notes (Signed)
Hospitalist Progress Note         [facility]    Daily Progress Note: 02/24/2017    Assessment/Plan:     1. Hypotension: Reported as blood pressure 60/40 in cardiology in the office, patient denies symptoms. Appears to be holding in 90s now on midodrine after  lasix this AM and  of metoprolol this afternoon, but dropped yesterday after coreg and was given small bolus by cardiology.  Sounds like was recently started on midodrine at [facility] general.  Holding lisinopril, Entresto, and metolazone PRN, but sounds like medications were adjusted after recent Ef of 22%.  Unable to assess orthostatics as bed bound.    2. 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 Intake CXR reports mild pulm vascular congestion. Lasix adjusted with plans for  PO tomorrow, tolerated  this AM. Salt and fluid restrictions ordered.    3. Chronic hypercapnic respiratory failure with ? Of hypoxia: Hypercapnia appears chronic with VBG here showing elevated CO2 with normal pH, and ABG in [facility] showing similar (pH of 7.44, paCO2 of 57.9).  No acute respiratory symptoms reported. He reports he has intermittently been on O2 at rehab. Currently saturating well at 2L-RT consulted to wean as tolerated.    4. Hypothyroidism: TSH of 4.31 at [facility] on 02/09/17.  Now 2.7. Continue daily for now.    5. Peripheral arterial disease: Reports leg weakness as the reason why he was in rehab. Continue ASA, Warfarin, Pentoxifylline.  Chronic stasis changes noted to both legs.  PT has evaluated and recommends to continue SNF.    6. Chronic atrial fibrillation, s/p ICD: Continue BB as tolerated. Digoxin every other day. Continue warfarin, pharmacy consulted.      7. Pulmonary hypertension severe per cath in 2010: With Echo findings as above.  Continue home lasix, meds as above.    8. Bilateral lymphedema: Due to #2. Continue Lasix, salt and fluid restrictions.    9. 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 Attempt to find balance between heart failure medications and hypotesnion.    10. Hypokalemia: Improved, Continue oral K+ with diuretics. Mag okay.    11. Hx of Gout: Resume home ulroic, gabapentin. Norco available at  prn.    12. Warfarin Anticoagulation: INR therapeutic. Pharmacy consulted.    13. Ventral Hernia containing Bowel: Patient reports had been hospitalized with what sounds like concerns for bowel obstruction.  CT from [facility] from Earlier this month reports ventral hernia containing bowel without evidence of obstruction.  He denies any abdominal pain, n/v.  Reports more diarrhea and had been on miralax. He also reports he was supposed to follow up with [facility] as outpt today.  This otherwise does not seem acutely worse. Continue diet as tolerated.    14. GERD: Continue home PPI.    15. 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  Notes Obesity with impaired mobility    16. Dysuria: Report hx of chronic UTI.  Will check UA, UCx.    PPX: Warfarin as above.    Dispo: Monitor BP and titrate medications for heart failure as BP will tolerate. F/up cardiac recs.  Continue PT. Hypercapnia appears chronic.  Recently d/c'd from [facility], and came from rehab. Anticipate return to rehab after d/c.??CM following  ??  Subjective:     Patient reports some lightheadedness intermittently. Denies any CP or palpitations.  No SOB. Reports dysuria which he states is chronic, but due to UTI. Also reports episode of incontinence overnight.     General ROS: (-) for fever, chills,  Respiratory ROS: (-) for cough, shortness of breath  Cardiovascular ROS: (+) for chronic leg swelling, Lightheadedness, (-) for chest pain, palpitations,   Gastrointestinal ROS: (+) diarrhea, (-) for abdominal pain, nausea,vomiting  GU ROS: Reports chronic dysuria, Incontinence last night.  Musculoskeletal ROS: (+) for impaired mobility, arthritis, hx of gout.    Objective:   Physical Exam:     Visit Vitals   ??? BP 92/56   ??? Pulse 81   ??? Temp 97.8 ??F (36.6 ??C)   ??? Resp 22   ??? Ht  (1.854 m)   ??? Wt 144.4 kg (318 lb 6.4 oz)   ??? SpO2 98%   ??? BMI 42.01 kg/m2    O2 Flow Rate (L/min): 2 l/min O2 Device: Nasal cannula    Temp (24hrs), Avg:97.8 ??F (36.6 ??C), Min:97.3 ??F (36.3 ??C), Max:98.5 ??F (36.9 ??C)    09/28 0701 - 09/28 1900  In: -   Out: 300 [Urine:300]   09/26 1901 - 09/28 0700  In: 850 [P.O.:600; I.V.:250]  Out: 1250 [Urine:1250]    General: Alert, CM, pleasant to conversation, in no acute distress, On 2L N/C O2.  CV: Regular rate and rhythm, no murmurs, rubs, gallops  Pulm: Lungs clear to auscultation bilaterally, no focal wheezes, crackles, rhonchi  GI: Soft, nontender, nondistended, active bowel sounds  Extremity: No clubbing, cyanosis, trace B LE Edema with chronic stasis changes with chronic discoloration. Chronic changes c/w long standing gout in the B LE.  Skin: Warm, dry. Some scaling at the feet.      Data Review:       24 Hour Results:  Recent Results (from the past 24 hour(s))   PROTHROMBIN TIME + INR    Collection Time: 02/24/17 12:49 AM   Result Value Ref Range    Prothrombin time 35.4 (H) 10.2 - 12.9 seconds    INR 2.9 (H) 0.1 - 1.1     TSH 3RD GENERATION    Collection Time: 02/24/17 12:49 AM   Result Value Ref Range    TSH 2.700 0.358 - 3.740 uIU/mL   METABOLIC PANEL, BASIC    Collection Time: 02/24/17 12:49 AM   Result Value Ref Range    Sodium 140 136 - 145 mEq/L    Potassium 3.7 3.5 - 5.1 mEq/L    Chloride 98 98 - 107 mEq/L    CO2 37 (H) 21 - 32 mEq/L     Glucose 91 74 - 106 mg/dl    BUN 13 7 - 25 mg/dl    Creatinine 0.8 0.6 - 1.3 mg/dl    GFR est AA >16      GFR est non-AA >60      Calcium 8.3 (L) 8.5 - 10.1 mg/dl    Anion gap 6 5 - 15 mmol/L   MAGNESIUM    Collection Time: 02/24/17 12:49 AM   Result Value Ref Range    Magnesium 2.1 1.6 - 2.6 mg/dl       Problem List:  Problem List as of 02/24/2017  Date Reviewed: 11/12/2012  Codes Class Noted - Resolved    Acute hypercapnic respiratory failure (HCC) ICD-10-CM: J96.02  ICD-9-CM: 518.81  02/22/2017 - Present        Hyponatremia ICD-10-CM: E87.1  ICD-9-CM: 276.1  09/03/2010 - Present        Hematuria ICD-10-CM: R31.9  ICD-9-CM: 599.70  09/03/2010 - Present        CAD (coronary artery disease), native coronary artery ICD-10-CM: I25.10  ICD-9-CM: 414.01  09/03/2010 - Present        Defibrillator discharge ICD-10-CM: Z45.02  ICD-9-CM: V71.89  09/03/2010 - Present        Cardiomyopathy (HCC) ICD-10-CM: I42.9  ICD-9-CM: 425.4  09/03/2010 - Present        Atrial fibrillation (HCC) ICD-10-CM: I48.91  ICD-9-CM: 427.31  09/03/2010 - Present        LBBB (left bundle branch block) ICD-10-CM: I44.7  ICD-9-CM: 426.3  09/03/2010 - Present        Primary localized osteoarthrosis, hand ICD-10-CM: M19.049  ICD-9-CM: 715.14  09/03/2010 - Present        ICD (implantable cardiac defibrillator) in place ICD-10-CM: Z95.810  ICD-9-CM: V45.02  09/03/2010 - Present        Pulmonary hypertension (HCC) ICD-10-CM: I27.20  ICD-9-CM: 416.8  09/03/2010 - Present        Syncope and collapse ICD-10-CM: R55  ICD-9-CM: 780.2  09/03/2010 - Present        Permanent atrial fibrillation (HCC) ICD-10-CM: I48.2  ICD-9-CM: 427.31  09/03/2010 - Present        HTN (hypertension), benign ICD-10-CM: I10  ICD-9-CM: 401.1  09/03/2010 - Present        Obesity, morbid (HCC) ICD-10-CM: E66.01  ICD-9-CM: 278.01  09/03/2010 - Present        Edema ICD-10-CM: R60.9  ICD-9-CM: 782.3  09/03/2010 - Present        Varicose veins of lower extremities with ulcer (HCC) ICD-10-CM: I83.009, L97.909   ICD-9-CM: 454.0  09/03/2010 - Present        Lymphedema ICD-10-CM: I89.0  ICD-9-CM: 457.1  09/03/2010 - Present               Medications reviewed  Current Facility-Administered Medications   Medication Dose Route Frequency   ??? metoprolol succinate (TOPROL-XL) XL tablet 25 mg  25 mg Oral DAILY   ??? [START ON 02/25/2017] furosemide (LASIX) tablet 40 mg  40 mg Oral DAILY   ??? warfarin (COUMADIN) tablet 1 mg  1 mg Oral ONCE   ??? potassium chloride (K-DUR, KLOR-CON) SR tablet 20 mEq  20 mEq Oral BID   ??? febuxostat (ULORIC) tablet 40 mg  40 mg Oral DAILY   ??? gabapentin (NEURONTIN) capsule 200 mg  200 mg Oral TID   ??? levothyroxine (SYNTHROID) tablet 100 mcg  100 mcg Oral 6am   ??? aspirin delayed-release tablet 81 mg  81 mg Oral DAILY   ??? digoxin (LANOXIN) tablet 0.125 mg  0.125 mg Oral EVERY OTHER DAY   ??? omeprazole (PRILOSEC) capsule 20 mg  20 mg Oral DAILY   ??? pentoxifylline CR (TRENTAL) tablet 400 mg  400 mg Oral TID WITH MEALS   ??? midodrine (PROAMITINE) tablet 10 mg  10 mg Oral TID WITH MEALS   ??? sodium chloride (NS) flush 5-10 mL  5-10 mL IntraVENous Q8H   ??? sodium chloride (NS) flush 5-10 mL  5-10 mL IntraVENous PRN   ??? naloxone (NARCAN) injection 0.1 mg  0.1 mg IntraVENous PRN   ??? acetaminophen (TYLENOL) tablet 650 mg  650 mg Oral Q6H PRN   ??? HYDROcodone-acetaminophen (NORCO) 5-325 mg per tablet 1 Tab  1 Tab Oral Q4H PRN   ??? morphine injection 1 mg  1 mg IntraVENous Q4H PRN   ??? alum-mag hydroxide-simeth (MYLANTA) oral suspension 30 mL  30 mL Oral Q4H PRN   ??? albuterol (PROVENTIL VENTOLIN) nebulizer solution 2.5 mg  2.5 mg Nebulization Q6H PRN   ??? *Warfarin Pharmacy to Dose  1 Each Other Rx Dosing/Monitoring        Care Plan discussed with: Patient/Family, Nurse and Case Manager    Total time spent with patient: 27 minutes.    Scarlette Ar, MD  February 24, 2017  14:48

## 2017-02-24 NOTE — Other (Signed)
Bedside and Verbal shift change report given to Ana,RN (oncoming nurse) by Timothy,RN (offgoing nurse). Report included the following information SBAR, Kardex, Intake/Output, MAR, Recent Results and Cardiac Rhythm Paced NSR.

## 2017-02-25 ENCOUNTER — Inpatient Hospital Stay: Admit: 2017-02-25 | Payer: MEDICARE | Primary: Family Medicine

## 2017-02-25 LAB — URINALYSIS W/ RFLX MICROSCOPIC
Bilirubin: NEGATIVE
Glucose: NEGATIVE mg/dl
Ketone: NEGATIVE mg/dl
Nitrites: NEGATIVE
Protein: NEGATIVE mg/dl
Specific gravity: 1.01 (ref 1.005–1.030)
Urobilinogen: 2 mg/dl — ABNORMAL HIGH (ref 0.0–1.0)
pH (UA): 7 (ref 5.0–9.0)

## 2017-02-25 LAB — POC URINE MICROSCOPIC

## 2017-02-25 LAB — PROTHROMBIN TIME + INR
INR: 2.5 — ABNORMAL HIGH (ref 0.1–1.1)
Prothrombin time: 30.1 seconds — ABNORMAL HIGH (ref 10.2–12.9)

## 2017-02-25 MED ORDER — FUROSEMIDE 10 MG/ML IJ SOLN
10 mg/mL | Freq: Once | INTRAMUSCULAR | Status: AC
Start: 2017-02-25 — End: 2017-02-25
  Administered 2017-02-25: 22:00:00 via INTRAVENOUS

## 2017-02-25 MED ORDER — DEXTROMETHORPHAN-GUAIFENESIN 10 MG-100 MG/5 ML SYRUP
100-10 mg/5 mL | Freq: Four times a day (QID) | ORAL | Status: DC | PRN
Start: 2017-02-25 — End: 2017-03-01
  Administered 2017-02-25: 21:00:00 via ORAL

## 2017-02-25 MED ORDER — BENZONATATE 100 MG CAP
100 mg | Freq: Three times a day (TID) | ORAL | Status: DC | PRN
Start: 2017-02-25 — End: 2017-03-01
  Administered 2017-02-25 – 2017-03-01 (×7): via ORAL

## 2017-02-25 MED ORDER — IPRATROPIUM-ALBUTEROL 2.5 MG-0.5 MG/3 ML NEB SOLUTION
2.5 mg-0.5 mg/3 ml | RESPIRATORY_TRACT | Status: DC | PRN
Start: 2017-02-25 — End: 2017-03-01

## 2017-02-25 MED ORDER — FUROSEMIDE 10 MG/ML IJ SOLN
10 mg/mL | Freq: Two times a day (BID) | INTRAMUSCULAR | Status: DC
Start: 2017-02-25 — End: 2017-02-28
  Administered 2017-02-26 – 2017-02-28 (×5): via INTRAVENOUS

## 2017-02-25 MED FILL — METOPROLOL SUCCINATE SR 25 MG 24 HR TAB: 25 mg | ORAL | Qty: 1

## 2017-02-25 MED FILL — DIGOXIN 0.125 MG TAB: 0.125 mg | ORAL | Qty: 1

## 2017-02-25 MED FILL — PENTOXIFYLLINE SR 400 MG TAB: 400 mg | ORAL | Qty: 1

## 2017-02-25 MED FILL — BENZONATATE 100 MG CAP: 100 mg | ORAL | Qty: 1

## 2017-02-25 MED FILL — OMEPRAZOLE 20 MG CAP, DELAYED RELEASE: 20 mg | ORAL | Qty: 1

## 2017-02-25 MED FILL — BD POSIFLUSH NORMAL SALINE 0.9 % INJECTION SYRINGE: INTRAMUSCULAR | Qty: 10

## 2017-02-25 MED FILL — FUROSEMIDE 10 MG/ML IJ SOLN: 10 mg/mL | INTRAMUSCULAR | Qty: 2

## 2017-02-25 MED FILL — FUROSEMIDE 40 MG TAB: 40 mg | ORAL | Qty: 1

## 2017-02-25 MED FILL — SYNTHROID 100 MCG TABLET: 100 mcg | ORAL | Qty: 1

## 2017-02-25 MED FILL — GABAPENTIN 100 MG CAP: 100 mg | ORAL | Qty: 2

## 2017-02-25 MED FILL — MIDODRINE 5 MG TAB: 5 mg | ORAL | Qty: 2

## 2017-02-25 MED FILL — DEXTROMETHORPHAN-GUAIFENESIN 10 MG-100 MG/5 ML SYRUP: 100-10 mg/5 mL | ORAL | Qty: 10

## 2017-02-25 MED FILL — ULORIC 40 MG TABLET: 40 mg | ORAL | Qty: 1

## 2017-02-25 MED FILL — POTASSIUM CHLORIDE SR 20 MEQ TAB, PARTICLES/CRYSTALS: 20 mEq | ORAL | Qty: 1

## 2017-02-25 MED FILL — ASPIRIN 81 MG TAB, DELAYED RELEASE: 81 mg | ORAL | Qty: 1

## 2017-02-25 NOTE — Other (Signed)
Bedside and Verbal shift change report given to GERRI, RN  (oncoming nurse) by Ellin GoodieIVINE, RN  (offgoing nurse). Report included the following information SBAR, Kardex, MAR, Recent Results and Cardiac Rhythm V-Paced.

## 2017-02-25 NOTE — Progress Notes (Signed)
[facility] ([facility]   CARDIOLOGY PROGRESS NOTE  RECS:  ? On BB and midodrine  DC midodrine  Hold BB as needed  Hold vasodilators indefinitely with hypotension  Reasonable to continue digoxin for now  PO lasix  Previously on Entresto + Metolazone, currently being held  D/c VKA (labile INRs).  Plan to start Eliquis once INR drifts close to 2.    [name]  ??    ASSESSMENT:  [name] is a 71 y.o. male  With:    1.  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 Jude)       - ECHO??01/12/17:??EF 22% with severely decreased LV function, grade III DD, mod-severe basal to mid A/L + I/L wall segments, severely dilated LA, dilated RH with normal systolic function, severe MR, mod TR and RVSP 56 mmHG  2.  Non-obstructive CAD   3.  H/o VT   4.  AF - chronic permanent  5.  Obesity   6.  Hypotension    Active Problems:    Acute hypercapnic respiratory failure (HCC) (02/22/2017)        SUBJECTIVE:  No CP or SOB    ROS:  Review of Systems - Respiratory ROS: no cough, shortness of breath, or wheezing  Gastrointestinal ROS: no abdominal pain, change in bowel habits, or black or bloody stools  Musculoskeletal ROS: negative  Neurological ROS: no TIA or stroke symptoms  O/w 10/14 systems negative    VS:   Visit Vitals   ??? BP 98/61 (BP 1 Location: Left arm, BP Patient Position: At rest)   ??? Pulse 83   ??? Temp 97.7 ??F (36.5 ??C)   ??? Resp 9   ??? Ht 6\' 1"  (1.854 m)   ??? Wt 144.4 kg (318 lb 6.4 oz)   ??? SpO2 96%   ??? BMI 42.01 kg/m2     Last 3 Recorded Weights in this Encounter    02/22/17 1458 02/22/17 2218   Weight: 142.9 kg (315 lb) 144.4 kg (318 lb 6.4 oz)      Body mass index is 42.01 kg/(m^2).     Intake/Output Summary (Last 24 hours) at 02/25/17 0948  Last data filed at 02/25/17 9604   Gross per 24 hour   Intake              280 ml   Output             1375 ml   Net            -1095 ml     TELE personally reviewed by me: VP    EXAM:   GEN: Awake/Alert/Oriented x 4. NAD. Non-toxic   HEENT: EOMI, non-icteric sclera, no pallor   NECK: thick, no appreciable JVD, negative carotid bruits   CV: Regular distant s1s2, no s3, 2/6 mid-late systolic murmur  PULM:  Non-labored BS, diminished bibasilar BS (effort/habitus), no wheezes or rales   ABD: Soft, Nontender, + BS, obese & benign   EXT: Warm, well perfused, + 1-2 pitting BLE edema, distal pulses intact   Neuro: Grossly non-focal   SKIN: Warm, Dry, Intact   PSYCH:  Insight/judgment - intact    Labs:  Basic Metabolic Profile   Lab Results   Component Value Date/Time    NA 140 02/24/2017 12:49 AM    NA 142 02/23/2017 03:47 AM    NA 140 02/22/2017 08:20 PM    K 3.7 02/24/2017 12:49  AM    K 3.2 (L) 02/23/2017 03:47 AM    K 3.4 (L) 02/22/2017 08:20 PM    CL 98 02/24/2017 12:49 AM    CL 98 02/23/2017 03:47 AM    CL 96 (L) 02/22/2017 08:20 PM    CO2 37 (H) 02/24/2017 12:49 AM    CO2 37 (H) 02/23/2017 03:47 AM    CO2 38 (H) 02/22/2017 08:20 PM    BUN 13 02/24/2017 12:49 AM    BUN 16 02/23/2017 03:47 AM    BUN 16 02/22/2017 08:20 PM    CREA 0.8 02/24/2017 12:49 AM    CREA 0.9 02/23/2017 03:47 AM    CREA 0.9 02/22/2017 08:20 PM    GLU 91 02/24/2017 12:49 AM    GLU 92 02/23/2017 03:47 AM    GLU 96 02/22/2017 08:20 PM    CA 8.3 (L) 02/24/2017 12:49 AM    CA 8.0 (L) 02/23/2017 03:47 AM    CA 8.4 (L) 02/22/2017 08:20 PM    MG 2.1 02/24/2017 12:49 AM    MG 2.1 03/19/2009 02:40 AM    MG 1.9 03/17/2009 08:05 PM        CBC w/Diff    Lab Results   Component Value Date/Time    WBC 5.2 02/23/2017 03:47 AM    WBC 5.8 02/22/2017 08:20 PM    WBC 8.5 03/19/2009 02:40 AM    RBC 3.98 02/23/2017 03:47 AM    RBC 4.51 02/22/2017 08:20 PM    RBC 4.13 (L) 03/19/2009 02:40 AM    HGB 11.8 (L) 02/23/2017 03:47 AM    HGB 13.0 02/22/2017 08:20 PM    HGB 12.6 (L) 03/19/2009 02:40 AM    HCT 38.0 02/23/2017 03:47 AM    HCT 42.8 02/22/2017 08:20 PM    HCT 37.9 03/19/2009 02:40 AM    MCV 95.5 02/23/2017 03:47 AM    MCV 94.9 02/22/2017 08:20 PM     MCV 91.8 03/19/2009 02:40 AM    MCH 29.6 02/23/2017 03:47 AM    MCH 28.8 02/22/2017 08:20 PM    MCH 30.6 03/19/2009 02:40 AM    MCHC 31.1 02/23/2017 03:47 AM    MCHC 30.4 02/22/2017 08:20 PM    MCHC 33.3 03/19/2009 02:40 AM    RDW 18.9 (H) 03/19/2009 02:40 AM    RDW 18.0 (H) 03/17/2009 08:05 PM    PLT 148 02/23/2017 03:47 AM    PLT 185 02/22/2017 08:20 PM    PLT 190 03/19/2009 02:40 AM    Lab Results   Component Value Date/Time    ANEU 7.3 03/19/2009 02:40 AM    ANEU 6.7 03/17/2009 08:05 PM    ABL 0.6 (L) 03/19/2009 02:40 AM    ABL 0.5 (L) 03/17/2009 08:05 PM    ABM 0.6 03/19/2009 02:40 AM    ABM 0.4 03/17/2009 08:05 PM    ABE 0.1 03/19/2009 02:40 AM    ABE 0.0 03/17/2009 08:05 PM    ABB 0.0 03/19/2009 02:40 AM    ABB 0.0 03/17/2009 08:05 PM    GRANS 64.7 (H) 02/23/2017 03:47 AM    GRANS 66.4 (H) 02/22/2017 08:20 PM    GRANS 85 (H) 03/19/2009 02:40 AM    LYMPH 23.6 (L) 02/23/2017 03:47 AM    LYMPH 24.4 (L) 02/22/2017 08:20 PM    LYMPH 7 (L) 03/19/2009 02:40 AM    MONOS 8.8 02/23/2017 03:47 AM    MONOS 7.0 02/22/2017 08:20 PM    MONOS 7 03/19/2009 02:40 AM  EOS 2.3 02/23/2017 03:47 AM    EOS 1.5 02/22/2017 08:20 PM    EOS 1 03/19/2009 02:40 AM    BASOS 0.4 02/23/2017 03:47 AM    BASOS 0.5 02/22/2017 08:20 PM    BASOS 0 03/19/2009 02:40 AM        Cardiac Enzymes   Lab Results   Component Value Date/Time    CPK 158 03/18/2009 01:12 PM    CPK 151 03/18/2009 05:42 AM    CPK 96 03/17/2009 08:05 PM    CKMB 1.3 03/18/2009 01:12 PM    CKMB 2.2 03/18/2009 05:42 AM    CKMB 1.8 03/17/2009 08:05 PM    TROIQ <0.04 03/18/2009 01:12 PM    TROIQ <0.04 03/18/2009 05:42 AM    TROIQ <0.04 03/17/2009 08:05 PM        Coagulation   Lab Results   Component Value Date/Time    PTP 30.1 (H) 02/25/2017 03:04 AM    PTP 35.4 (H) 02/24/2017 12:49 AM    PTP 36.9 (H) 02/23/2017 03:47 AM    INR 2.5 (H) 02/25/2017 03:04 AM    INR 2.9 (H) 02/24/2017 12:49 AM    INR 3.0 (H) 02/23/2017 03:47 AM        Medications:     Current Facility-Administered Medications   Medication Dose Route Frequency   ??? metoprolol succinate (TOPROL-XL) XL tablet 25 mg  25 mg Oral DAILY   ??? furosemide (LASIX) tablet 40 mg  40 mg Oral DAILY   ??? potassium chloride (K-DUR, KLOR-CON) SR tablet 20 mEq  20 mEq Oral BID   ??? febuxostat (ULORIC) tablet 40 mg  40 mg Oral DAILY   ??? gabapentin (NEURONTIN) capsule 200 mg  200 mg Oral TID   ??? levothyroxine (SYNTHROID) tablet 100 mcg  100 mcg Oral 6am   ??? aspirin delayed-release tablet 81 mg  81 mg Oral DAILY   ??? digoxin (LANOXIN) tablet 0.125 mg  0.125 mg Oral EVERY OTHER DAY   ??? omeprazole (PRILOSEC) capsule 20 mg  20 mg Oral DAILY   ??? pentoxifylline CR (TRENTAL) tablet 400 mg  400 mg Oral TID WITH MEALS   ??? midodrine (PROAMITINE) tablet 10 mg  10 mg Oral TID WITH MEALS   ??? sodium chloride (NS) flush 5-10 mL  5-10 mL IntraVENous Q8H          Tawni Pummel, MD  [facility] ([facility])    February 25, 2017  9:48 AM

## 2017-02-25 NOTE — Other (Signed)
Bedside and Verbal shift change report given to Gerri, Charity fundraiser (Cabin crew) by Jones Broom  (offgoing nurse). Report included the following information SBAR, Kardex, Intake/Output, MAR, Recent Results and Cardiac Rhythm V-paced.

## 2017-02-25 NOTE — Progress Notes (Signed)
Problem: Falls - Risk of  Goal: *Absence of Falls  Document Schmid Fall Risk and appropriate interventions in the flowsheet.   Outcome: Progressing Towards Goal  Fall Risk Interventions:            Medication Interventions: Bed/chair exit alarm, Patient to call before getting OOB, Teach patient to arise slowly    Elimination Interventions: Bed/chair exit alarm, Call light in reach, Elevated toilet seat, Toilet paper/wipes in reach, Toileting schedule/hourly rounds, Urinal in reach

## 2017-02-25 NOTE — Other (Signed)
Urine sample sent to the lab for UA and culture.

## 2017-02-25 NOTE — Other (Signed)
Patient is asking something for his cough, he said it bothers him because it's starting to hurt his ribs whenever he coughs out. Informed Dr. Jerl Mina.     Awaiting for his order of cough med.

## 2017-02-25 NOTE — Other (Signed)
Patient told me not to go to his room unless he called me, doesn't wanna be bothered.

## 2017-02-25 NOTE — Other (Deleted)
PHYSICIAN???S DOCUMENTATION REQUEST   This Form is Not a Permanent Document in the Medical Record         By submitting this query, we are merely seeking further clarification of documentation to accurately reflect all conditions that you are monitoring, evaluating, treating or that extend the hospitalization or utilize additional resources of care. Please utilize your independent clinical judgment when addressing the question(s) below.    Dear Dr. Jerl Mina,    The medical record contains the following conflicting diagnoses:      Documentation by Dr. Lanney Gins on 9/29  Active Problems:    Acute hypercapnic respiratory failure (HCC) (02/22/2017)    Documentation by ED PROVDER NOTE AND H&P  Acute Respiratory Failure    Documentation by Dr. Jerl Mina  Chronic hypercapnic respiratory failure with ? Of hypoxia: Hypercapnia appears chronic with VBG here showing elevated CO2 with normal pH, and ABG in [facility] showing similar (pH of 7.44, paCO2 of 57.9).  No acute respiratory symptoms reported. He reports he has intermittently been on O2 at rehab. Currently saturating well at 2L-RT consulted to wean as tolerated.  ??    Diagnostics  Oxygen sat 90% on 2lpm    Treatment given   Supplemental Oxygen via Nasal Cannula    It is unclear whether the patient has a diagnosis of Acute Respiratory Failure or  Chronic Respiratory Failure  ------------------------------------------------------------------------------------------------------------------    As the Attending Physician for this patient, please help to clarify the most appropriate diagnosis for this patient.      PLEASE DOCUMENT ANY ADDITIONAL DIAGNOSES AND/OR SPECIFICITY IN THE PROGRESS NOTES AND/OR DISCHARGE SUMMARY.    Thank you,  Teresa Coombs MD, CCS, CCDS, CDIP  Clinical Documentation Specialist  [phone]

## 2017-02-25 NOTE — Other (Deleted)
PHYSICIAN???S DOCUMENTATION REQUEST   This Form is Not a Permanent Document in the Medical Record         By submitting this query, we are merely seeking further clarification of documentation to accurately reflect all conditions that you are monitoring, evaluating, treating or that extend the hospitalization or utilize additional resources of care. Please utilize your independent clinical judgment when addressing the question(s) below.    Dear Dr. Jerl Minaamarato.    The medical record contains the following conflicting diagnoses:      Documentation by Dr. Lanney Ginshakrabarti on 9/29  . 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 [employer])  ??????????- ECHO??01/12/17:??EF 22% with severely decreased LV function, grade III DD, mod-severe basal to mid A/L + I/L wall segments, severely dilated LA, dilated RH with normal systolic function, severe MR, mod TR and RVSP 56 mmHG    Documentation by Dr. 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 Mild engorgement of  the central central pulmonary vasculature may at most represent mild pulmonary  vascular congestion. No overt congestive change    Treatment given   Lasix, Coreg, Lisinopril    It is unclear whether the patient has a diagnosis of Acute on Chronic Systolic/Diastolic CHF or Chronic mixed diastolic/systolic CHF.  ------------------------------------------------------------------------------------------------------------------    As the Attending Physician for this patient, please help to clarify the most appropriate diagnosis for this patient.      PLEASE DOCUMENT ANY ADDITIONAL DIAGNOSES AND/OR SPECIFICITY IN THE PROGRESS NOTES AND/OR DISCHARGE SUMMARY.    Thank you,  Teresa CoombsGul-Shabana Emeka Lindner MD, CCS, CCDS, CDIP   Clinical Documentation Specialist  [phone]

## 2017-02-25 NOTE — Other (Signed)
Bedside and Verbal shift change report given to DIVINE RN  (oncoming nurse) by ANA RN  (offgoing nurse). Report included the following information Kardex, MAR, Recent Results, Cardiac Rhythm V-PACED and Alarm Parameters .

## 2017-02-25 NOTE — Progress Notes (Signed)
Hospitalist Progress Note         [facility]    Daily Progress Note: 02/25/2017    Assessment/Plan:     1. Hypotension: Reported as blood pressure 60/40 in cardiology in the office, patient denies symptoms. Appears to be holding in 90-100s. Cardiology as stopped both midodrine and metoprolol. Did receive  Lasix this AM.  Holding lisinopril, Entresto, and metolazone PRN, but sounds like medications were adjusted after recent Ef of 22% at [facility].  Unable to assess orthostatics as bed bound.     2. 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 Intake CXR reports mild pulm vascular congestion. More cough reported today, and now with worsened crackles. Continue to f/up cardiac recs regarding medication balance b/w CHF and hypotension.  Salt and fluid restrictions ordered.    3. Chronic hypercapnic respiratory failure with ? Of hypoxia (for query-chronic, not acute): Hypercapnia appears chronic with VBG here showing elevated CO2 with normal pH, and ABG in [facility] showing similar (pH of 7.44, paCO2 of 57.9).  No acute respiratory symptoms reported. He reports he has intermittently been on O2 at rehab. Currently on 1L now-RT consulted to wean as tolerated.  Will add nebs prn.    4. Hypothyroidism: TSH of 4.31 at [facility] on 02/09/17.  Now 2.7. Continue daily for now.    5. Peripheral arterial disease: Reports leg weakness as the reason why he was in rehab. Continue ASA, Pentoxifylline.  Chronic stasis changes noted to both legs.  PT has evaluated and recommends to continue SNF.    6. Chronic atrial fibrillation, s/p ICD: Continue BB as tolerated. Digoxin every other day. Cardiology reports plans to change warfarin to eliquis once INR near 2.     7. Pulmonary hypertension severe per cath in 2010: With Echo findings as above.  Continue home lasix, meds as above.    8. Bilateral lymphedema: Due to #2. Continue Lasix, salt and fluid restrictions.    9. 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 Attempt to find balance between heart failure medications and hypotesnion.    10. Hypokalemia: Improved, Continue oral K+ with diuretics. Mag okay.    11. Hx of Gout: Resume home ulroic, gabapentin. Norco available at  prn.    12. Warfarin Anticoagulation: Cardiology plans to transition to Eliquis now.    13. Ventral Hernia containing Bowel: Patient reports had been hospitalized with what sounds like concerns for bowel obstruction, He mentions ileus as a concern at [facility].  CT from [facility] from Earlier this month reports ventral hernia containing bowel without evidence of obstruction.  He denies any abdominal pain, n/v.  Reports more diarrhea and had been on miralax. Today he reports mostly poor appetite, but sounds more like based on desire (I could eat a [employer] double cheeseburger, without the bread), rather than pain, nausea or vomiting.    14. GERD: Continue home PPI.    15. 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  Notes Obesity with impaired mobility    16. Dysuria: Report hx of chronic UTI.  Awaiting UA, UCx.    PPX: Cards plans  to transition from warfarin to eliquis.    Dispo: Sounds like heart failure is acutely worse today, complicated by changes meds and concerns about hypotension.  Resume IV diuretics.  Monitor BP and titrate medications for heart failure as BP will tolerate. F/up cardiac recs. Continue PT. Hypercapnia appears chronic.  [facility] and [facility] from rehab. Anticipate return to  rehab after d/c.??CM following as initial facility is declining to accept back.  ??  Subjective:     Patient reports more cough and sputum today.  Reports poor appetite. Still some dysuria but no further incontinence.    General ROS: (-) for fever, chills,  Respiratory ROS: (+) for cough, sputum  Cardiovascular ROS: (+) for chronic leg swelling, Lightheadedness, (-) for chest pain, palpitations,   Gastrointestinal ROS: Now asking for miralax. Reports poor appetite, (-) for abdominal pain, nausea,vomiting  GU ROS: Reports chronic dysuria  Musculoskeletal ROS: (+) for impaired mobility, arthritis, hx of gout.    Objective:   Physical Exam:     Visit Vitals   ??? BP 104/73 (BP 1 Location: Left arm, BP Patient Position: At rest)   ??? Pulse 79   ??? Temp 97.9 ??F (36.6 ??C)   ??? Resp 15   ??? Ht 6\' 1"  (1.854 m)   ??? Wt 144.4 kg (318 lb 6.4 oz)   ??? SpO2 96%   ??? BMI 42.01 kg/m2    O2 Flow Rate (L/min): 1 l/min O2 Device: Nasal cannula    Temp (24hrs), Avg:98.1 ??F (36.7 ??C), Min:97.7 ??F (36.5 ??C), Max:98.5 ??F (36.9 ??C)    09/29 0701 - 09/29 1900  In: 530 [P.O.:520; I.V.:10]  Out: 750 [Urine:750]   09/27 1901 - 09/29 0700  In: -   Out: 1975 [Urine:1975]    General: Alert, CM, pleasant to conversation, frequent cough, On 1L N/C O2.  CV: Regular rate and rhythm, no murmurs, rubs, gallops  Pulm: Lungs with diffuse crackles. No wheeze or ronchi.  GI: Soft, nontender, nondistended, active bowel sounds, large ventral hernia.  Extremity: No clubbing, cyanosis, trace B LE Edema with chronic stasis changes with chronic discoloration. Chronic changes c/w long standing gout in the B LE.  Skin: Warm, dry. Some scaling at the feet.      Data Review:       24 Hour Results:  Recent Results (from the past 24 hour(s))   PROTHROMBIN TIME + INR    Collection Time: 02/25/17  3:04 AM   Result Value Ref Range    Prothrombin time 30.1 (H) 10.2 - 12.9 seconds    INR 2.5 (H) 0.1 - 1.1         Problem List:   Problem List as of 02/25/2017  Date Reviewed: 11/12/2012          Codes Class Noted - Resolved    Acute hypercapnic respiratory failure (HCC) ICD-10-CM: J96.02  ICD-9-CM: 518.81  02/22/2017 - Present        Hyponatremia ICD-10-CM: E87.1  ICD-9-CM: 276.1  09/03/2010 - Present        Hematuria ICD-10-CM: R31.9  ICD-9-CM: 599.70  09/03/2010 - Present        CAD (coronary artery disease), native coronary artery ICD-10-CM: I25.10  ICD-9-CM: 414.01  09/03/2010 - Present        Defibrillator discharge ICD-10-CM: Z45.02  ICD-9-CM: V71.89  09/03/2010 - Present        Cardiomyopathy (HCC) ICD-10-CM: I42.9  ICD-9-CM: 425.4  09/03/2010 - Present  Atrial fibrillation (HCC) ICD-10-CM: I48.91  ICD-9-CM: 427.31  09/03/2010 - Present        LBBB (left bundle branch block) ICD-10-CM: I44.7  ICD-9-CM: 426.3  09/03/2010 - Present        Primary localized osteoarthrosis, hand ICD-10-CM: M19.049  ICD-9-CM: 715.14  09/03/2010 - Present        ICD (implantable cardiac defibrillator) in place ICD-10-CM: Z95.810  ICD-9-CM: V45.02  09/03/2010 - Present        Pulmonary hypertension (HCC) ICD-10-CM: I27.20  ICD-9-CM: 416.8  09/03/2010 - Present        Syncope and collapse ICD-10-CM: R55  ICD-9-CM: 780.2  09/03/2010 - Present        Permanent atrial fibrillation (HCC) ICD-10-CM: I48.2  ICD-9-CM: 427.31  09/03/2010 - Present        HTN (hypertension), benign ICD-10-CM: I10  ICD-9-CM: 401.1  09/03/2010 - Present        Obesity, morbid (HCC) ICD-10-CM: E66.01  ICD-9-CM: 278.01  09/03/2010 - Present        Edema ICD-10-CM: R60.9  ICD-9-CM: 782.3  09/03/2010 - Present        Varicose veins of lower extremities with ulcer (HCC) ICD-10-CM: I83.009, L97.909  ICD-9-CM: 454.0  09/03/2010 - Present        Lymphedema ICD-10-CM: I89.0  ICD-9-CM: 457.1  09/03/2010 - Present               Medications reviewed  Current Facility-Administered Medications   Medication Dose Route Frequency   ??? guaiFENesin-dextromethorphan (ROBITUSSIN DM) 100-10 mg/5 mL syrup 5 mL  5 mL Oral Q6H PRN    ??? benzonatate (TESSALON) capsule 100 mg  100 mg Oral TID PRN   ??? furosemide (LASIX) tablet 40 mg  40 mg Oral DAILY   ??? polyethylene glycol (MIRALAX) packet 17 g  17 g Oral DAILY PRN   ??? potassium chloride (K-DUR, KLOR-CON) SR tablet 20 mEq  20 mEq Oral BID   ??? febuxostat (ULORIC) tablet 40 mg  40 mg Oral DAILY   ??? gabapentin (NEURONTIN) capsule 200 mg  200 mg Oral TID   ??? levothyroxine (SYNTHROID) tablet 100 mcg  100 mcg Oral 6am   ??? aspirin delayed-release tablet 81 mg  81 mg Oral DAILY   ??? digoxin (LANOXIN) tablet 0.125 mg  0.125 mg Oral EVERY OTHER DAY   ??? omeprazole (PRILOSEC) capsule 20 mg  20 mg Oral DAILY   ??? pentoxifylline CR (TRENTAL) tablet 400 mg  400 mg Oral TID WITH MEALS   ??? sodium chloride (NS) flush 5-10 mL  5-10 mL IntraVENous Q8H   ??? sodium chloride (NS) flush 5-10 mL  5-10 mL IntraVENous PRN   ??? naloxone (NARCAN) injection 0.1 mg  0.1 mg IntraVENous PRN   ??? acetaminophen (TYLENOL) tablet 650 mg  650 mg Oral Q6H PRN   ??? HYDROcodone-acetaminophen (NORCO) 5-325 mg per tablet 1 Tab  1 Tab Oral Q4H PRN   ??? morphine injection 1 mg  1 mg IntraVENous Q4H PRN   ??? alum-mag hydroxide-simeth (MYLANTA) oral suspension 30 mL  30 mL Oral Q4H PRN   ??? albuterol (PROVENTIL VENTOLIN) nebulizer solution 2.5 mg  2.5 mg Nebulization Q6H PRN        Care Plan discussed with: Patient/Family and Nurse    Total time spent with patient: 29 minutes.    Scarlette Ar, MD  February 25, 2017  17:20

## 2017-02-26 LAB — METABOLIC PANEL, BASIC
Anion gap: 3 mmol/L — ABNORMAL LOW (ref 5–15)
BUN: 12 mg/dl (ref 7–25)
CO2: 38 mEq/L — ABNORMAL HIGH (ref 21–32)
Calcium: 8.2 mg/dl — ABNORMAL LOW (ref 8.5–10.1)
Chloride: 95 mEq/L — ABNORMAL LOW (ref 98–107)
Creatinine: 0.7 mg/dl (ref 0.6–1.3)
GFR est AA: >60
GFR est non-AA: >60
Glucose: 109 mg/dl — ABNORMAL HIGH (ref 74–106)
Potassium: 3.8 mEq/L (ref 3.5–5.1)
Sodium: 136 mEq/L (ref 136–145)

## 2017-02-26 LAB — CBC WITH AUTOMATED DIFF
BASOPHILS: 0.2 % (ref 0–3)
EOSINOPHILS: 1 % (ref 0–5)
HCT: 40.6 % (ref 37.0–50.0)
HGB: 12.3 gm/dl — ABNORMAL LOW (ref 12.4–17.2)
IMMATURE GRANULOCYTES: 0.2 % (ref 0.0–3.0)
LYMPHOCYTES: 12.2 % — ABNORMAL LOW (ref 28–48)
MCH: 28.8 pg (ref 23.0–34.6)
MCHC: 30.3 gm/dl (ref 30.0–36.0)
MCV: 95.1 fL (ref 80.0–98.0)
MONOCYTES: 6.3 % (ref 1–13)
MPV: 11 fL — ABNORMAL HIGH (ref 6.0–10.0)
NEUTROPHILS: 80.1 % — ABNORMAL HIGH (ref 34–64)
NRBC: 0 (ref 0–0)
PLATELET: 129 10*3/uL — ABNORMAL LOW (ref 140–450)
RBC: 4.27 M/uL (ref 3.80–5.70)
RDW-SD: 60.5 — ABNORMAL HIGH (ref 35.1–43.9)
WBC: 4.9 10*3/uL (ref 4.0–11.0)

## 2017-02-26 LAB — PROTHROMBIN TIME + INR
INR: 1.8 — ABNORMAL HIGH (ref 0.1–1.1)
Prothrombin time: 21.2 seconds — ABNORMAL HIGH (ref 10.2–12.9)

## 2017-02-26 LAB — CULTURE, URINE: Culture result: 20000 — AB

## 2017-02-26 LAB — NT-PRO BNP: NT pro-BNP: 6402 pg/ml — ABNORMAL HIGH (ref 0.0–125.0)

## 2017-02-26 MED ORDER — APIXABAN 5 MG TABLET
5 mg | Freq: Two times a day (BID) | ORAL | Status: DC
Start: 2017-02-26 — End: 2017-03-01
  Administered 2017-02-27 – 2017-03-01 (×6): via ORAL

## 2017-02-26 MED FILL — FUROSEMIDE 10 MG/ML IJ SOLN: 10 mg/mL | INTRAMUSCULAR | Qty: 4

## 2017-02-26 MED FILL — BENZONATATE 100 MG CAP: 100 mg | ORAL | Qty: 1

## 2017-02-26 MED FILL — PENTOXIFYLLINE SR 400 MG TAB: 400 mg | ORAL | Qty: 1

## 2017-02-26 MED FILL — SYNTHROID 100 MCG TABLET: 100 mcg | ORAL | Qty: 1

## 2017-02-26 MED FILL — GABAPENTIN 100 MG CAP: 100 mg | ORAL | Qty: 2

## 2017-02-26 MED FILL — ASPIRIN 81 MG TAB, DELAYED RELEASE: 81 mg | ORAL | Qty: 1

## 2017-02-26 MED FILL — POTASSIUM CHLORIDE SR 20 MEQ TAB, PARTICLES/CRYSTALS: 20 mEq | ORAL | Qty: 1

## 2017-02-26 MED FILL — ULORIC 40 MG TABLET: 40 mg | ORAL | Qty: 1

## 2017-02-26 MED FILL — BD POSIFLUSH NORMAL SALINE 0.9 % INJECTION SYRINGE: INTRAMUSCULAR | Qty: 10

## 2017-02-26 MED FILL — OMEPRAZOLE 20 MG CAP, DELAYED RELEASE: 20 mg | ORAL | Qty: 1

## 2017-02-26 MED FILL — DEXTROMETHORPHAN-GUAIFENESIN 10 MG-100 MG/5 ML SYRUP: 100-10 mg/5 mL | ORAL | Qty: 10

## 2017-02-26 NOTE — Progress Notes (Signed)
Problem: Pressure Injury - Risk of  Goal: *Prevention of pressure injury  Document Braden Scale and appropriate interventions in the flowsheet.   Outcome: Progressing Towards Goal  Pressure Injury Interventions:  Sensory Interventions: Assess changes in LOC, Check visual cues for pain, Discuss PT/OT consult with provider, Keep linens dry and wrinkle-free, Float heels, Minimize linen layers, Pad between skin to skin, Turn and reposition approx. every two hours (pillows and wedges if needed)    Moisture Interventions: Absorbent underpads, Apply protective barrier, creams and emollients, Check for incontinence Q2 hours and as needed, Limit adult briefs, Maintain skin hydration (lotion/cream), Moisture barrier    Activity Interventions: Increase time out of bed, Pressure redistribution bed/mattress(bed type), PT/OT evaluation    Mobility Interventions: Float heels, HOB 30 degrees or less, PT/OT evaluation, Turn and reposition approx. every two hours(pillow and wedges)    Nutrition Interventions: Document food/fluid/supplement intake, Discuss nutritional consult with provider, Offer support with meals,snacks and hydration    Friction and Shear Interventions: Apply protective barrier, creams and emollients, Feet elevated on foot rest, Foam dressings/transparent film/skin sealants, HOB 30 degrees or less       Problem: Gas Exchange - Impaired  Goal: *Absence of hypoxia  Outcome: Progressing Towards Goal  Goal to wean off patient on NC. Currently at [facility] with O2 Sat=92-93%. Incentive spirometer at bedside which patient were able to perform as tolerated. Will continue to monitor to reach goal.

## 2017-02-26 NOTE — Other (Signed)
Text paged MD Camarato: this morning's BNP=[phone].

## 2017-02-26 NOTE — Progress Notes (Signed)
[facility] ([facility]   CARDIOLOGY PROGRESS NOTE  RECS:  BP essentially unchanged now off concurrent BB and midodrine (SBP 110 this AM)  Needs more diuresis, continue - give lasix as long as SBP >  Hold vasodilators indefinitely with hypotension  Reasonable to continue digoxin for now  Previously on Entresto + Metolazone, currently being held  D/c VKA (labile INRs). ??Plan to start Eliquis once INR drifts close to 2.    Discussed with Dr. Jerl Mina and pt  ??  Annalysa Nunez  ??  ??  ASSESSMENT:  [name] is a 71 y.o. male  With:  ??  1. 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)  ??????????- ECHO??01/12/17:??EF 22% with severely decreased LV function, grade III DD, mod-severe basal to mid A/L + I/L wall segments, severely dilated LA, dilated RH with normal systolic function, severe MR, mod TR and RVSP 56 mmHG  2. ??Non-obstructive CAD   3. ??H/o VT   4. ??AF - chronic permanent  5. ??Obesity   6. ??Hypotension        SUBJECTIVE:  + cough and dyspnea    ROS:  Review of Systems -   Gastrointestinal ROS: no abdominal pain, change in bowel habits, or black or bloody stools  Musculoskeletal ROS: negative  Neurological ROS: no TIA or stroke symptoms  O/w 10/14 systems negative    VS:   Visit Vitals   ??? BP 98/51   ??? Pulse 80   ??? Temp 98.9 ??F (37.2 ??C)   ??? Resp 29   ??? Ht 6\' 1"  (1.854 m)   ??? Wt 141.3 kg (311 lb 8.2 oz)   ??? SpO2 92%   ??? BMI 41.1 kg/m2     Last 3 Recorded Weights in this Encounter    02/22/17 1458 02/22/17 2218 02/26/17 0601   Weight: 142.9 kg (315 lb) 144.4 kg (318 lb 6.4 oz) 141.3 kg (311 lb 8.2 oz)      Body mass index is 41.1 kg/(m^2).     Intake/Output Summary (Last 24 hours) at 02/26/17 0810  Last data filed at 02/26/17 0601   Gross per 24 hour   Intake              890 ml   Output             1850 ml   Net             -960 ml     TELE personally reviewed by me: VP    EXAM:  GEN: Awake/Alert/Oriented x 4. NAD. Non-toxic    HEENT:??EOMI, non-icteric sclera, no pallor   NECK:??thick, no appreciable??JVD, negative carotid bruits   CV: Regular distant s1s2, no s3, 2/6 mid-late systolic murmur  PULM: ??Non-labored BS, diminished bibasilar BS (effort/habitus), no wheezes or rales   ABD: Soft, Nontender, + BS, obese &??benign   EXT: Warm, well perfused, + 1-2 pitting BLE??edema, distal pulses intact   Neuro: Grossly non-focal   SKIN: Warm, Dry, Intact   PSYCH: ??Insight/judgment - intact    Labs:  Basic Metabolic Profile   Lab Results   Component Value Date/Time    NA 136 02/26/2017 05:32 AM    NA 140 02/24/2017 12:49 AM    NA 142 02/23/2017 03:47 AM    K 3.8 02/26/2017 05:32 AM    K 3.7 02/24/2017 12:49 AM    K 3.2 (L) 02/23/2017 03:47 AM    CL  95 (L) 02/26/2017 05:32 AM    CL 98 02/24/2017 12:49 AM    CL 98 02/23/2017 03:47 AM    CO2 38 (H) 02/26/2017 05:32 AM    CO2 37 (H) 02/24/2017 12:49 AM    CO2 37 (H) 02/23/2017 03:47 AM    BUN 12 02/26/2017 05:32 AM    BUN 13 02/24/2017 12:49 AM    BUN 16 02/23/2017 03:47 AM    CREA 0.7 02/26/2017 05:32 AM    CREA 0.8 02/24/2017 12:49 AM    CREA 0.9 02/23/2017 03:47 AM    GLU 109 (H) 02/26/2017 05:32 AM    GLU 91 02/24/2017 12:49 AM    GLU 92 02/23/2017 03:47 AM    CA 8.2 (L) 02/26/2017 05:32 AM    CA 8.3 (L) 02/24/2017 12:49 AM    CA 8.0 (L) 02/23/2017 03:47 AM    MG 2.1 02/24/2017 12:49 AM    MG 2.1 03/19/2009 02:40 AM    MG 1.9 03/17/2009 08:05 PM        CBC w/Diff    Lab Results   Component Value Date/Time    WBC 4.9 02/26/2017 05:32 AM    WBC 5.2 02/23/2017 03:47 AM    WBC 5.8 02/22/2017 08:20 PM    RBC 4.27 02/26/2017 05:32 AM    RBC 3.98 02/23/2017 03:47 AM    RBC 4.51 02/22/2017 08:20 PM    HGB 12.3 (L) 02/26/2017 05:32 AM    HGB 11.8 (L) 02/23/2017 03:47 AM    HGB 13.0 02/22/2017 08:20 PM    HCT 40.6 02/26/2017 05:32 AM    HCT 38.0 02/23/2017 03:47 AM    HCT 42.8 02/22/2017 08:20 PM    MCV 95.1 02/26/2017 05:32 AM    MCV 95.5 02/23/2017 03:47 AM    MCV 94.9 02/22/2017 08:20 PM     MCH 28.8 02/26/2017 05:32 AM    MCH 29.6 02/23/2017 03:47 AM    MCH 28.8 02/22/2017 08:20 PM    MCHC 30.3 02/26/2017 05:32 AM    MCHC 31.1 02/23/2017 03:47 AM    MCHC 30.4 02/22/2017 08:20 PM    RDW 18.9 (H) 03/19/2009 02:40 AM    RDW 18.0 (H) 03/17/2009 08:05 PM    PLT 129 (L) 02/26/2017 05:32 AM    PLT 148 02/23/2017 03:47 AM    PLT 185 02/22/2017 08:20 PM    Lab Results   Component Value Date/Time    ANEU 7.3 03/19/2009 02:40 AM    ANEU 6.7 03/17/2009 08:05 PM    ABL 0.6 (L) 03/19/2009 02:40 AM    ABL 0.5 (L) 03/17/2009 08:05 PM    ABM 0.6 03/19/2009 02:40 AM    ABM 0.4 03/17/2009 08:05 PM    ABE 0.1 03/19/2009 02:40 AM    ABE 0.0 03/17/2009 08:05 PM    ABB 0.0 03/19/2009 02:40 AM    ABB 0.0 03/17/2009 08:05 PM    GRANS 80.1 (H) 02/26/2017 05:32 AM    GRANS 64.7 (H) 02/23/2017 03:47 AM    GRANS 66.4 (H) 02/22/2017 08:20 PM    LYMPH 12.2 (L) 02/26/2017 05:32 AM    LYMPH 23.6 (L) 02/23/2017 03:47 AM    LYMPH 24.4 (L) 02/22/2017 08:20 PM    MONOS 6.3 02/26/2017 05:32 AM    MONOS 8.8 02/23/2017 03:47 AM    MONOS 7.0 02/22/2017 08:20 PM    EOS 1.0 02/26/2017 05:32 AM    EOS 2.3 02/23/2017 03:47 AM    EOS 1.5 02/22/2017 08:20 PM  BASOS 0.2 02/26/2017 05:32 AM    BASOS 0.4 02/23/2017 03:47 AM    BASOS 0.5 02/22/2017 08:20 PM        Cardiac Enzymes   Lab Results   Component Value Date/Time    CPK 158 03/18/2009 01:12 PM    CPK 151 03/18/2009 05:42 AM    CPK 96 03/17/2009 08:05 PM    CKMB 1.3 03/18/2009 01:12 PM    CKMB 2.2 03/18/2009 05:42 AM    CKMB 1.8 03/17/2009 08:05 PM    TROIQ <0.04 03/18/2009 01:12 PM    TROIQ <0.04 03/18/2009 05:42 AM    TROIQ <0.04 03/17/2009 08:05 PM        Coagulation   Lab Results   Component Value Date/Time    PTP 21.2 (H) 02/26/2017 05:32 AM    PTP 30.1 (H) 02/25/2017 03:04 AM    PTP 35.4 (H) 02/24/2017 12:49 AM    INR 1.8 (H) 02/26/2017 05:32 AM    INR 2.5 (H) 02/25/2017 03:04 AM    INR 2.9 (H) 02/24/2017 12:49 AM        Medications:    Current Facility-Administered Medications    Medication Dose Route Frequency   ??? furosemide (LASIX) injection 40 mg  40 mg IntraVENous BID WITH MEALS   ??? potassium chloride (K-DUR, KLOR-CON) SR tablet 20 mEq  20 mEq Oral BID   ??? febuxostat (ULORIC) tablet 40 mg  40 mg Oral DAILY   ??? gabapentin (NEURONTIN) capsule 200 mg  200 mg Oral TID   ??? levothyroxine (SYNTHROID) tablet 100 mcg  100 mcg Oral 6am   ??? aspirin delayed-release tablet 81 mg  81 mg Oral DAILY   ??? digoxin (LANOXIN) tablet 0.125 mg  0.125 mg Oral EVERY OTHER DAY   ??? omeprazole (PRILOSEC) capsule 20 mg  20 mg Oral DAILY   ??? pentoxifylline CR (TRENTAL) tablet 400 mg  400 mg Oral TID WITH MEALS   ??? sodium chloride (NS) flush 5-10 mL  5-10 mL IntraVENous Q8H          Tawni Pummel, MD  [facility] ([facility])    February 26, 2017  8:10 AM

## 2017-02-26 NOTE — Other (Signed)
Bedside and Verbal shift change report given to Gerri,RN  (Cabin crewoncoming nurse) by Rosann Auerbachrish, RN (offgoing nurse). Report included the following information SBAR, Kardex, Intake/Output, MAR, Recent Results and Cardiac Rhythm V-pacing with PVC's.

## 2017-02-26 NOTE — Progress Notes (Signed)
Hospitalist Progress Note         [facility]    Daily Progress Note: 02/26/2017    Assessment/Plan:     1. Hypotension: Reported as blood pressure 60/40 in cardiology in the office, patient denies symptoms. Appears to be holding in 90-100s. Cardiology has stopped both midodrine and metoprolol. Continue IV lasix so long as SBP > 80.  Holding lisinopril, Entresto, and metolazone PRN, but sounds like medications were adjusted after recent Ef of 22% at [facility].  Unable to assess orthostatics as bed bound.     2. 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  Echo in [facility] system from 12/2016 reports EF of 22% (decreased from prior), G III DD, Severely dilated LA, Dilated RV with normal function, Mod PHTN with RVSP of 56%, and Mod pericardial effusion. Intake CXR reports mild pulm vascular congestion. SOB improving with IV lasix, but cough still present. Continue to f/up cardiac recs regarding medication balance b/w CHF and hypotension, but planned for just diuresis at this point.  Salt and fluid restrictions ordered.    3. Chronic hypercapnic respiratory failure with likely chronic hypoxia (for query-chronic, not acute): Hypercapnia appears chronic with VBG here showing elevated CO2 with normal pH, and ABG in [facility] showing similar (pH of 7.44, paCO2 of 57.9).  No acute respiratory symptoms reported. He reports he has intermittently been on O2 at rehab. Currently on 1.5 L now-RT consulted to wean as tolerated.  Nebs prn.    4. Hypothyroidism: TSH of 4.31 at [facility] on 02/09/17.  Now 2.7. Continue daily for now.    5. Peripheral arterial disease: Reports leg weakness as the reason why he was in rehab. Continue ASA, Pentoxifylline.  Chronic stasis changes noted to both legs.  PT has evaluated and recommends to continue SNF.    6. Chronic atrial fibrillation, s/p ICD: Continue BB as tolerated. Digoxin  every other day. Cardiology reports plans to change warfarin to eliquis once INR near 2, 1.8 today, begin this evening.    7. Pulmonary hypertension severe per cath in 2010: With Echo findings as above. Meds as above.    8. Bilateral lymphedema: Due to #2. Continue Lasix, salt and fluid restrictions.    9. 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 Attempt to find balance between heart failure medications and hypotesnion.    10. Hypokalemia: Improved, Continue oral K+ with diuretics. Mag okay.    11. Hx of Gout: Resume home ulroic, gabapentin. Norco available at  prn.    12. Longterm Anticoagulation: Transition to Eliquis now.    13. Ventral Hernia containing Bowel: Patient reports had been hospitalized with what sounds like concerns for bowel obstruction, He mentions ileus as a concern at [facility].  CT from [facility] from Earlier this month reports ventral hernia containing bowel without evidence of obstruction.  He denies any abdominal pain, n/v.  Reports more diarrhea and had been on miralax. Today he reports mostly poor appetite, but sounds more like based on desire (I could eat a [employer] double cheeseburger, without the bread), rather than pain, nausea or vomiting.    14. GERD: Continue home PPI.    15. 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  Notes Obesity with impaired mobility    16. Dysuria: Report hx of chronic UTI.  Reports improved today. Trace LE on UA. UCx in process, but holding off on abx unless symptoms worsen.Marland Kitchen    PPX: Begin eliquis.    Dispo: Heart failure is acutely on 9/29, improved today, may be ready to convert back to oral lasix soon. complicated  by changes meds and concerns about hypotension.  Monitor BP and titrate medications for heart failure  as BP will tolerate. F/up cardiac recs, and consider discharge once Cardiology feels medication regimen is optimized. Continue PT. Hypercapnia appears chronic.  Recently d/c'd from [facility], and came from rehab. Anticipate return to rehab after d/c.??CM following as initial facility is declining to accept back, will need repeat authorization.  ??  Subjective:     Patient reports still some cough but improved SOB. Dysuria improved.    General ROS: (-) for fever, chills,  Respiratory ROS: Improved SOB, (+) for sputum.  Cardiovascular ROS: (+) for chronic leg swelling (-) for chest pain, palpitations,   Gastrointestinal ROS: Now asking for miralax. Reports poor appetite, (-) for abdominal pain, nausea,vomiting  GU ROS: Reports chronic dysuria-better today.  Musculoskeletal ROS: (+) for impaired mobility, arthritis, hx of gout.    Objective:   Physical Exam:     Visit Vitals   ??? BP 113/72   ??? Pulse 85   ??? Temp 98 ??F (36.7 ??C)   ??? Resp 25   ??? Ht  (1.854 m)   ??? Wt 141.3 kg (311 lb 8.2 oz)   ??? SpO2 93%   ??? BMI 41.1 kg/m2    O2 Flow Rate (L/min): 1.5 l/min O2 Device: Nasal cannula    Temp (24hrs), Avg:98.3 ??F (36.8 ??C), Min:97.9 ??F (36.6 ??C), Max:98.9 ??F (37.2 ??C)    09/30 0701 - 09/30 1900  In: 444 [P.O.:444]  Out: -    09/28 1901 - 09/30 0700  In: 890 [P.O.:880; I.V.:10]  Out: 2825 [Urine:2825]    General: Alert, CM, pleasant to conversation, cough less frequent,  On N/C O2.  CV: Regular rate and rhythm, no murmurs, rubs, gallops  Pulm: minimal crackles at Posterior bases. No wheeze or ronchi.  GI: Soft, nontender, nondistended, active bowel sounds, large ventral hernia.  Extremity: No clubbing, cyanosis, non-pitting B LE Edema with chronic stasis changes with chronic discoloration. Chronic changes c/w long standing gout in the B LE.  Skin: Warm, dry. Some scaling at the feet.      Data Review:       24 Hour Results:  Recent Results (from the past 24 hour(s))   CULTURE, URINE     Collection Time: 02/25/17  6:35 PM   Result Value Ref Range    Culture result Too Young To Read     URINALYSIS W/ RFLX MICROSCOPIC    Collection Time: 02/25/17  6:35 PM   Result Value Ref Range    Color YELLOW YELLOW,STRAW      Appearance CLEAR CLEAR      Glucose NEGATIVE NEGATIVE,Negative mg/dl    Bilirubin NEGATIVE NEGATIVE,Negative      Ketone NEGATIVE NEGATIVE,Negative mg/dl    Specific gravity 1.610 1.005 - 1.030      Blood SMALL (A) NEGATIVE,Negative      pH (UA) 7.0 5.0 - 9.0      Protein NEGATIVE NEGATIVE,Negative mg/dl    Urobilinogen 2.0 (H) 0.0 - 1.0 mg/dl    Nitrites NEGATIVE NEGATIVE,Negative      Leukocyte Esterase TRACE (A) NEGATIVE,Negative     POC URINE MICROSCOPIC    Collection Time:  02/25/17  6:35 PM   Result Value Ref Range    Epithelial cells, squamous OCCASIONAL /LPF    WBC 1-4 /HPF    RBC 1-4 /HPF    Bacteria OCCASIONAL /HPF   PROTHROMBIN TIME + INR    Collection Time: 02/26/17  5:32 AM   Result Value Ref Range    Prothrombin time 21.2 (H) 10.2 - 12.9 seconds    INR 1.8 (H) 0.1 - 1.1     METABOLIC PANEL, BASIC    Collection Time: 02/26/17  5:32 AM   Result Value Ref Range    Sodium 136 136 - 145 mEq/L    Potassium 3.8 3.5 - 5.1 mEq/L    Chloride 95 (L) 98 - 107 mEq/L    CO2 38 (H) 21 - 32 mEq/L    Glucose 109 (H) 74 - 106 mg/dl    BUN 12 7 - 25 mg/dl    Creatinine 0.7 0.6 - 1.3 mg/dl    GFR est AA >16      GFR est non-AA >60      Calcium 8.2 (L) 8.5 - 10.1 mg/dl    Anion gap 3 (L) 5 - 15 mmol/L   CBC WITH AUTOMATED DIFF    Collection Time: 02/26/17  5:32 AM   Result Value Ref Range    WBC 4.9 4.0 - 11.0 1000/mm3    RBC 4.27 3.80 - 5.70 M/uL    HGB 12.3 (L) 12.4 - 17.2 gm/dl    HCT 10.9 60.4 - 54.0 %    MCV 95.1 80.0 - 98.0 fL    MCH 28.8 23.0 - 34.6 pg    MCHC 30.3 30.0 - 36.0 gm/dl    PLATELET 981 (L) 191 - 450 1000/mm3    MPV 11.0 (H) 6.0 - 10.0 fL    RDW-SD 60.5 (H) 35.1 - 43.9      NRBC 0 0 - 0      IMMATURE GRANULOCYTES 0.2 0.0 - 3.0 %    NEUTROPHILS 80.1 (H) 34 - 64 %     LYMPHOCYTES 12.2 (L) 28 - 48 %    MONOCYTES 6.3 1 - 13 %    EOSINOPHILS 1.0 0 - 5 %    BASOPHILS 0.2 0 - 3 %   NT-PRO BNP    Collection Time: 02/26/17  5:32 AM   Result Value Ref Range    NT pro-BNP 6402.0 (H) 0.0 - 125.0 pg/ml       Problem List:  Problem List as of 02/26/2017  Date Reviewed: Nov 18, 2012          Codes Class Noted - Resolved    Acute hypercapnic respiratory failure (HCC) ICD-10-CM: J96.02  ICD-9-CM: 518.81  02/22/2017 - Present        Hyponatremia ICD-10-CM: E87.1  ICD-9-CM: 276.1  09/03/2010 - Present        Hematuria ICD-10-CM: R31.9  ICD-9-CM: 599.70  09/03/2010 - Present        CAD (coronary artery disease), native coronary artery ICD-10-CM: I25.10  ICD-9-CM: 414.01  09/03/2010 - Present        Defibrillator discharge ICD-10-CM: Z45.02  ICD-9-CM: V71.89  09/03/2010 - Present        Cardiomyopathy (HCC) ICD-10-CM: I42.9  ICD-9-CM: 425.4  09/03/2010 - Present        Atrial fibrillation (HCC) ICD-10-CM: I48.91  ICD-9-CM: 427.31  09/03/2010 - Present        LBBB (left bundle branch block) ICD-10-CM: I44.7  ICD-9-CM: 426.3  09/03/2010 - Present  Primary localized osteoarthrosis, hand ICD-10-CM: M19.049  ICD-9-CM: 715.14  09/03/2010 - Present        ICD (implantable cardiac defibrillator) in place ICD-10-CM: Z95.810  ICD-9-CM: V45.02  09/03/2010 - Present        Pulmonary hypertension (HCC) ICD-10-CM: I27.20  ICD-9-CM: 416.8  09/03/2010 - Present        Syncope and collapse ICD-10-CM: R55  ICD-9-CM: 780.2  09/03/2010 - Present        Permanent atrial fibrillation (HCC) ICD-10-CM: I48.2  ICD-9-CM: 427.31  09/03/2010 - Present        HTN (hypertension), benign ICD-10-CM: I10  ICD-9-CM: 401.1  09/03/2010 - Present        Obesity, morbid (HCC) ICD-10-CM: E66.01  ICD-9-CM: 278.01  09/03/2010 - Present        Edema ICD-10-CM: R60.9  ICD-9-CM: 782.3  09/03/2010 - Present        Varicose veins of lower extremities with ulcer (HCC) ICD-10-CM: I83.009, L97.909  ICD-9-CM: 454.0  09/03/2010 - Present        Lymphedema ICD-10-CM: I89.0   ICD-9-CM: 457.1  09/03/2010 - Present               Medications reviewed  Current Facility-Administered Medications   Medication Dose Route Frequency   ??? guaiFENesin-dextromethorphan (ROBITUSSIN DM) 100-10 mg/5 mL syrup 5 mL  5 mL Oral Q6H PRN   ??? benzonatate (TESSALON) capsule 100 mg  100 mg Oral TID PRN   ??? furosemide (LASIX) injection 40 mg  40 mg IntraVENous BID WITH MEALS   ??? albuterol-ipratropium (DUO-NEB) 2.5 MG-0.5 MG/3 ML  3 mL Nebulization Q4H PRN   ??? polyethylene glycol (MIRALAX) packet 17 g  17 g Oral DAILY PRN   ??? potassium chloride (K-DUR, KLOR-CON) SR tablet 20 mEq  20 mEq Oral BID   ??? febuxostat (ULORIC) tablet 40 mg  40 mg Oral DAILY   ??? gabapentin (NEURONTIN) capsule 200 mg  200 mg Oral TID   ??? levothyroxine (SYNTHROID) tablet 100 mcg  100 mcg Oral 6am   ??? aspirin delayed-release tablet 81 mg  81 mg Oral DAILY   ??? digoxin (LANOXIN) tablet 0.125 mg  0.125 mg Oral EVERY OTHER DAY   ??? omeprazole (PRILOSEC) capsule 20 mg  20 mg Oral DAILY   ??? pentoxifylline CR (TRENTAL) tablet 400 mg  400 mg Oral TID WITH MEALS   ??? sodium chloride (NS) flush 5-10 mL  5-10 mL IntraVENous Q8H   ??? sodium chloride (NS) flush 5-10 mL  5-10 mL IntraVENous PRN   ??? naloxone (NARCAN) injection 0.1 mg  0.1 mg IntraVENous PRN   ??? acetaminophen (TYLENOL) tablet 650 mg  650 mg Oral Q6H PRN   ??? HYDROcodone-acetaminophen (NORCO) 5-325 mg per tablet 1 Tab  1 Tab Oral Q4H PRN   ??? morphine injection 1 mg  1 mg IntraVENous Q4H PRN   ??? alum-mag hydroxide-simeth (MYLANTA) oral suspension 30 mL  30 mL Oral Q4H PRN   ??? albuterol (PROVENTIL VENTOLIN) nebulizer solution 2.5 mg  2.5 mg Nebulization Q6H PRN        Care Plan discussed with: Patient/Family, Nurse and Consultant Dr. Lanney Ginshakrabarti    Total time spent with patient: 21 minutes.    Scarlette ArJoseph Germany Chelf, MD  February 26, 2017  14:23

## 2017-02-27 LAB — METABOLIC PANEL, BASIC
Anion gap: 5 mmol/L (ref 5–15)
BUN: 11 mg/dl (ref 7–25)
CO2: 38 mEq/L — ABNORMAL HIGH (ref 21–32)
Calcium: 8.6 mg/dl (ref 8.5–10.1)
Chloride: 95 mEq/L — ABNORMAL LOW (ref 98–107)
Creatinine: 0.7 mg/dl (ref 0.6–1.3)
GFR est AA: >60
GFR est non-AA: >60
Glucose: 77 mg/dl (ref 74–106)
Potassium: 3.8 mEq/L (ref 3.5–5.1)
Sodium: 138 mEq/L (ref 136–145)

## 2017-02-27 MED ORDER — FLU VACCINE QV 2018-19 (36 MOS+)(PF) 60 MCG (15 MCG X 4)/0.5 ML IM SUSP
60 mcg (15 mcg x 4)/0.5 mL | INTRAMUSCULAR | Status: AC
Start: 2017-02-27 — End: 2017-03-01
  Administered 2017-03-01: 16:00:00 via INTRAMUSCULAR

## 2017-02-27 MED FILL — POTASSIUM CHLORIDE SR 20 MEQ TAB, PARTICLES/CRYSTALS: 20 mEq | ORAL | Qty: 1

## 2017-02-27 MED FILL — PENTOXIFYLLINE SR 400 MG TAB: 400 mg | ORAL | Qty: 1

## 2017-02-27 MED FILL — DIGOXIN 0.125 MG TAB: 0.125 mg | ORAL | Qty: 1

## 2017-02-27 MED FILL — FUROSEMIDE 10 MG/ML IJ SOLN: 10 mg/mL | INTRAMUSCULAR | Qty: 4

## 2017-02-27 MED FILL — ELIQUIS 5 MG TABLET: 5 mg | ORAL | Qty: 1

## 2017-02-27 MED FILL — ULORIC 40 MG TABLET: 40 mg | ORAL | Qty: 1

## 2017-02-27 MED FILL — BENZONATATE 100 MG CAP: 100 mg | ORAL | Qty: 1

## 2017-02-27 MED FILL — BD POSIFLUSH NORMAL SALINE 0.9 % INJECTION SYRINGE: INTRAMUSCULAR | Qty: 10

## 2017-02-27 MED FILL — DEXTROMETHORPHAN-GUAIFENESIN 10 MG-100 MG/5 ML SYRUP: 100-10 mg/5 mL | ORAL | Qty: 10

## 2017-02-27 MED FILL — GABAPENTIN 100 MG CAP: 100 mg | ORAL | Qty: 2

## 2017-02-27 MED FILL — SYNTHROID 100 MCG TABLET: 100 mcg | ORAL | Qty: 1

## 2017-02-27 MED FILL — OMEPRAZOLE 20 MG CAP, DELAYED RELEASE: 20 mg | ORAL | Qty: 1

## 2017-02-27 MED FILL — ASPIRIN 81 MG TAB, DELAYED RELEASE: 81 mg | ORAL | Qty: 1

## 2017-02-27 NOTE — Progress Notes (Addendum)
OCCUPATIONAL THERAPY TREATMENT       Patient: [name] (71 y.o. male)  Date: 02/27/2017  Start Time:  1250 End Time: 1314    Primary Diagnosis: Acute hypercapnic respiratory failure (HCC)        Admit date: 02/22/2017  Insurance: Payor: HU[employer] MEDICARE / Plan: [facility] HU[employer] MEDICARE HMO / Product Type: Managed Care Medicare /      Precautions: Falls.      Ordered Weight Bearing Status:None      ASSESSMENT:    Based on the objective data described below, the patient presents with     Increased Balance and Increased Activity Tolerance    Patient demonstrated the following: good effort and good motivation     PLAN: Continue OT POC    Patient continues to benefit from skilled intervention to address the above impairments. Continue treatment per established plan of care.    Discharge Recommendations: SNF      EDUCATION:     Barriers to Learning/Limitations:  None  Education provided patient on  Functional mobility    SUBJECTIVE:     Patient " I have not sat up since thursday."    OBJECTIVE DATA SUMMARY:     Orders, labs, occupational therapy evaluation and chart reviewed on [name]. Discussed with Rosann Auerbach, RN.    Last O2 entry in flow sheets:  O2 Device: Nasal cannula (02/27/17 0402)  O2 Flow Rate (L/min): 1.5 l/min (02/27/17 0402)    Patient found: Bed and ICU/stepdown equip.  (-) bed/chair alarm.    Pain assessment: Not rated  Pain location: legs and back    Cognitive:   Mental Status:   Oriented x3  Communication:   grossly intact  Attention Span:   good(>33min)  Follows Commands:   intact and complex    Activities of Daily Living:  At bed level patient performed eating and grooming tasks with supervision and set-up.  Patient required total assist to don socks.    Mobility:  - Patient performed Supine to Sit with max. assist and 2 person.  - Patient completed Sit to Stand from EOB with max. assist, increased time, 2 person, increased height of bed and RW x 2 reps.  Patient  tolerated 1/2 standing with max. Assist of 2 for 10 seconds x 2 reps.    - Patient performed Sit to supine with max. assist, safety concerns, increased time and 1 person. Patient scooted up in bed with max. Assist of 2.    Activity Tolerance: fair-    - Patient tolerated sitting EOB for 10 minutes with Supervision to increase sitting tolerance for sitting level self care skills.    -  Patient demonstrating good static sitting balance.      Final Location: bed, all needs close and agrees to call for assistance.  (-) bed/chair alarm.    Jesse Fall, OTR/L  Pager: [phone]

## 2017-02-27 NOTE — Progress Notes (Signed)
Problem: Pressure Injury - Risk of  Goal: *Prevention of pressure injury  Document Braden Scale and appropriate interventions in the flowsheet.   Outcome: Progressing Towards Goal  Pressure Injury Interventions:  Sensory Interventions: Assess changes in LOC, Check visual cues for pain, Discuss PT/OT consult with provider, Float heels, Keep linens dry and wrinkle-free, Minimize linen layers, Pad between skin to skin, Turn and reposition approx. every two hours (pillows and wedges if needed)    Moisture Interventions: Absorbent underpads, Apply protective barrier, creams and emollients, Assess need for specialty bed, Check for incontinence Q2 hours and as needed, Limit adult briefs, Maintain skin hydration (lotion/cream), Moisture barrier, Offer toileting Q_hr    Activity Interventions: PT/OT evaluation, Pressure redistribution bed/mattress(bed type)    Mobility Interventions: Float heels, HOB 30 degrees or less, Pressure redistribution bed/mattress (bed type), PT/OT evaluation, Turn and reposition approx. every two hours(pillow and wedges)    Nutrition Interventions: Document food/fluid/supplement intake, Discuss nutritional consult with provider, Offer support with meals,snacks and hydration    Friction and Shear Interventions: Apply protective barrier, creams and emollients, Feet elevated on foot rest, HOB 30 degrees or less

## 2017-02-27 NOTE — Progress Notes (Signed)
CM contacted PT on treatment team [facility] [phone] received voicemail return call back number left-[employer] requesting for updated therapy notes within 48hrs.

## 2017-02-27 NOTE — Progress Notes (Signed)
Medicine Progress Note    Date of Admission: 02/22/2017  Length of Stay: 5    Assessment And Plan     1. Hypotension: Sent to hospital from cardiology office due to hypotension..  Holding lisinopril, Entresto, and metolazone PRN, but sounds like medications were adjusted after recent Ef of 22% at [facility].    Blood pressure currently stable.  ??  2. 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  Echo in [place] system from 12/2016 reports EF of 22% (decreased from prior), G III DD, Severely dilated LA, Dilated RV with normal function, Mod PHTN with RVSP of 56%, and Mod pericardial effusion. Intake CXR reports mild pulm vascular congestion. SOB improving with IV lasix, but cough still present.  Cardiology recommended continue IV Lasix.  Monitor renal function  ??  3. Chronic hypercapnic and hypoxic respiratory failure:   No acute respiratory symptoms reported. He reports he has intermittently been on O2 at rehab. Currently on 1.5 L now-RT consulted to wean as tolerated.  Nebs prn.  ??  4. Hypothyroidism: Stable.  Continue levothyroxine  ??  5. Peripheral arterial disease: Reports leg weakness as the reason why he was in rehab. Continue ASA, Pentoxifylline.  Chronic stasis changes noted to both legs.  PT has evaluated and recommends to continue SNF.  ??  6. Chronic atrial fibrillation, s/p ICD: Continue BB as tolerated. Digoxin every other day.  Started on Eliquis.  ??  7. Pulmonary hypertension severe per cath in 2010: With Echo findings as above. Meds as above.  ??  8.  Extremity edema due to CHF.  Continue IV Lasix  ??  9. 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 Attempt to find balance between heart failure medications and hypotesnion.  ??  10. Hypokalemia: Improved with replacement.  3.8 now  ??  11. Hx of Gout: Resume home ulroic, gabapentin. Norco available at  prn.  ??  12. Longterm Anticoagulation: Transition to Eliquis now.  ??   13. Ventral Hernia containing Bowel: Patient reports had been hospitalized with what sounds like concerns for bowel obstruction, He mentions ileus as a concern at [facility].  CT from [facility] from Earlier this month reports ventral hernia containing bowel without evidence of obstruction.   No abdominal pain currently.  ??  14. GERD: Continue home PPI.  ??  15. 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  Notes Obesity with impaired mobility  ??  16. Dysuria: Currently better.   culture mixed flora.  Will repeat culture if patient had fever or any worsening symptoms  ??  PPX: On Eliquis    Unit floortime 35 minutes, more than half time spent in counseling and coordination of care        Discussed with patient. Updates regarding diagnose, tests results, prognosis, treatment  of the patient's medical conditions discussed in detail     Subjective:     Shortness of breath is better.  Legs are still swollen.  Patient has severe generalized weakness.     Objective:     Patient Vitals for the past 12 hrs:   Temp Pulse Resp BP SpO2   02/27/17 1002 - 94 24 111/77 98 %   02/27/17 0802 97.1 ??F (36.2 ??C) 83 22 99/65 96 %   02/27/17 0602 - 81 22  91/58 91 %   02/27/17 0410 98.4 ??F (36.9 ??C) - - - -   02/27/17 0402 98.4 ??F (36.9 ??C) 81 23 104/72 95 %   02/27/17 0202 - 86 20 102/68 (!) 89 %   02/27/17 0002 98.4 ??F (36.9 ??C) 88 17 103/72 93 %         Intake/Output Summary (Last 24 hours) at 02/27/17 1146  Last data filed at 02/27/17 1122   Gross per 24 hour   Intake             1080 ml   Output             3225 ml   Net            -2145 ml     Constitutional:??Awake and alert, NAD  HENT:??atraumatic, normocephalic, oropharynx clear ??  Eyes:??????conjunctiva normal  Neck:??????supple and trachea normal  Cardiovascular:?? Regular rate and rhythm, heart sounds normal, intact distal pulses   Pulmonary/Chest Wall: Basilar crackles  Abdominal:????????????appearance normal, soft, non-tender upon palpation,bowel sounds normal.  Neurological:??????awake, alert and oriented, CN intact, severe generalized weakness worse in lower extremity.  Extremities:??Dark skin on lower extremity.  2+ edema      Recent Results (from the past 24 hour(s))   METABOLIC PANEL, BASIC    Collection Time: 02/27/17  3:23 AM   Result Value Ref Range    Sodium 138 136 - 145 mEq/L    Potassium 3.8 3.5 - 5.1 mEq/L    Chloride 95 (L) 98 - 107 mEq/L    CO2 38 (H) 21 - 32 mEq/L    Glucose 77 74 - 106 mg/dl    BUN 11 7 - 25 mg/dl    Creatinine 0.7 0.6 - 1.3 mg/dl    GFR est AA >16      GFR est non-AA >60      Calcium 8.6 8.5 - 10.1 mg/dl    Anion gap 5 5 - 15 mmol/L       All Micro Results     Procedure Component Value Units Date/Time    CULTURE, URINE [[id number]]  (Abnormal) Collected:  02/25/17 1835    Order Status:  Completed Specimen:  Urine from Urine Updated:  02/26/17 1954     Culture result -- (A)        20,000 CFU/mL  Mixed Flora, three or more different organisms present  No Further Workup      CULTURE, URINE [[id number]]     Order Status:  Canceled Specimen:  Urine from Clean catch     MRSA SCREEN - PCR (NASAL) [[id number]]     Order Status:  Canceled Specimen:  Nasal from Nares           Imaging:    Xr Chest Sngl V    Result Date: 02/25/2017  Indication: Congestive heart failure.     IMPRESSION: Portable AP upright view the chest exposed at 5:37 PM February 25, 2017 reveals the heart is enlarged. The pattern is consistent with a cardiomyopathy or pericardial effusion. There is a small right pleural effusion with atelectasis in the underlying lung. The left lung is clear. The pulmonary vasculature is within normal limits. Transvenous pacemaker is again seen. There are calcified apical pulmonary and mediastinal lymph nodes. No change from February 22, 2017     Xr Chest Pa Lat    Result Date: 02/22/2017   EXAM: Frontal and lateral views of the chest. INDICATIONS: CHF COMPARISON: Chest radiograph dated 09/13/2011.  Impression: Prominent enlargement of the cardiac silhouette. Mild engorgement of the central central pulmonary vasculature may at most represent mild pulmonary vascular congestion. No overt congestive change. No large effusions. Pacemaker is unchanged. Probable calcified lymph nodes, suggesting granulomatous disease.           Echo Results  (Last 48 hours)    None          Current Facility-Administered Medications   Medication Dose Route Frequency   ??? apixaban (ELIQUIS) tablet 5 mg  5 mg Oral Q12H   ??? guaiFENesin-dextromethorphan (ROBITUSSIN DM) 100-10 mg/5 mL syrup 5 mL  5 mL Oral Q6H PRN   ??? benzonatate (TESSALON) capsule 100 mg  100 mg Oral TID PRN   ??? furosemide (LASIX) injection 40 mg  40 mg IntraVENous BID WITH MEALS   ??? albuterol-ipratropium (DUO-NEB) 2.5 MG-0.5 MG/3 ML  3 mL Nebulization Q4H PRN   ??? polyethylene glycol (MIRALAX) packet 17 g  17 g Oral DAILY PRN   ??? potassium chloride (K-DUR, KLOR-CON) SR tablet 20 mEq  20 mEq Oral BID   ??? febuxostat (ULORIC) tablet 40 mg  40 mg Oral DAILY   ??? gabapentin (NEURONTIN) capsule 200 mg  200 mg Oral TID   ??? levothyroxine (SYNTHROID) tablet 100 mcg  100 mcg Oral 6am   ??? aspirin delayed-release tablet 81 mg  81 mg Oral DAILY   ??? digoxin (LANOXIN) tablet 0.125 mg  0.125 mg Oral EVERY OTHER DAY   ??? omeprazole (PRILOSEC) capsule 20 mg  20 mg Oral DAILY   ??? pentoxifylline CR (TRENTAL) tablet 400 mg  400 mg Oral TID WITH MEALS   ??? sodium chloride (NS) flush 5-10 mL  5-10 mL IntraVENous Q8H   ??? sodium chloride (NS) flush 5-10 mL  5-10 mL IntraVENous PRN   ??? naloxone (NARCAN) injection 0.1 mg  0.1 mg IntraVENous PRN   ??? acetaminophen (TYLENOL) tablet 650 mg  650 mg Oral Q6H PRN   ??? HYDROcodone-acetaminophen (NORCO) 5-325 mg per tablet 1 Tab  1 Tab Oral Q4H PRN   ??? morphine injection 1 mg  1 mg IntraVENous Q4H PRN    ??? alum-mag hydroxide-simeth (MYLANTA) oral suspension 30 mL  30 mL Oral Q4H PRN   ??? albuterol (PROVENTIL VENTOLIN) nebulizer solution 2.5 mg  2.5 mg Nebulization Q6H PRN            Dragon medical dictation software was used for portions of this report. Unintended errors may occur.   Lawana Chambers, MD  02/27/2017 11:46 AM

## 2017-02-27 NOTE — Progress Notes (Signed)
[facility] ([facility]   CARDIOLOGY PROGRESS NOTE    PCP:  Dr. Lynnell Catalan, DO,   [facility]  [facility] Cardiologist:  Dr. Rozell Searing    RECS:  BP essentially unchanged now off concurrent BB and midodrine (SBP 110 this AM)  continue furosemide diuresis as long as SBP >   2060cc net NEG last 24hrs   Continue close f/u of Renal Function, Is/Os, Weights   Stopped warfarin b/o labile INRs   Started apixaban  bid with INR  down to 1.8   Discussed DOAC rx with Pt and spouse at length    ????  ASSESSMENT:  1. 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)  ??????????- ECHO??01/12/2017:??EF 22% with severely decreased LV function,    mod-severe basal to mid A/L + I/L wall segments,    severely dilated LA, dilated RH with normal systolic function,    severe MR, mod TR and est min RVSP 56 mmHG  2. ??Non-obstructive CAD   3. ??h/o VT   4. ??AF - chronic permanent  5. ??Obesity   6. ??Hypotension    SUBJECTIVE:  denies CP or SOB    VS:   Visit Vitals   ??? BP 99/65   ??? Pulse 83   ??? Temp 97.1 ??F (36.2 ??C)   ??? Resp 22   ??? Ht  (1.854 m)   ??? Wt 137.4 kg (302 lb 14.6 oz)   ??? SpO2 96%   ??? BMI 39.96 kg/m2       Intake/Output Summary (Last 24 hours) at 02/27/17 1038  Last data filed at 02/27/17 0929   Gross per 24 hour   Intake              840 ml   Output             2900 ml   Net            -2060 ml     TELE personally reviewed by me:  AF, BiVpacing as needed    EXAM:  General:  Skin warm, perfusion adequate  Neck: JVD is absent  Lungs:  rhonchi   Cardiac:  Regular Rhythm, MR murmur  Abdomen: soft, nl bowel sounds  Ext: Edema is better  Neuro: Alert and oriented; Nonfocal    Labs:    Basic Metabolic Profile   Lab Results   Component Value Date/Time    NA 138 02/27/2017 03:23 AM    NA 136 02/26/2017 05:32 AM    NA 140 02/24/2017 12:49 AM    K 3.8 02/27/2017 03:23 AM    K 3.8 02/26/2017 05:32 AM    K 3.7 02/24/2017 12:49 AM    CL 95 (L) 02/27/2017 03:23 AM     CL 95 (L) 02/26/2017 05:32 AM    CL 98 02/24/2017 12:49 AM    CO2 38 (H) 02/27/2017 03:23 AM    CO2 38 (H) 02/26/2017 05:32 AM    CO2 37 (H) 02/24/2017 12:49 AM    BUN 11 02/27/2017 03:23 AM    BUN 12 02/26/2017 05:32 AM    BUN 13 02/24/2017 12:49 AM    CREA 0.7 02/27/2017 03:23 AM    CREA 0.7 02/26/2017 05:32 AM    CREA 0.8 02/24/2017 12:49 AM    GLU 77 02/27/2017 03:23 AM    GLU 109 (H) 02/26/2017 05:32 AM    GLU 91 02/24/2017 12:49 AM  CA 8.6 02/27/2017 03:23 AM    CA 8.2 (L) 02/26/2017 05:32 AM    CA 8.3 (L) 02/24/2017 12:49 AM    MG 2.1 02/24/2017 12:49 AM    MG 2.1 03/19/2009 02:40 AM    MG 1.9 03/17/2009 08:05 PM        CBC w/Diff    Lab Results   Component Value Date/Time    WBC 4.9 02/26/2017 05:32 AM    WBC 5.2 02/23/2017 03:47 AM    WBC 5.8 02/22/2017 08:20 PM    RBC 4.27 02/26/2017 05:32 AM    RBC 3.98 02/23/2017 03:47 AM    RBC 4.51 02/22/2017 08:20 PM    HGB 12.3 (L) 02/26/2017 05:32 AM    HGB 11.8 (L) 02/23/2017 03:47 AM    HGB 13.0 02/22/2017 08:20 PM    HCT 40.6 02/26/2017 05:32 AM    HCT 38.0 02/23/2017 03:47 AM    HCT 42.8 02/22/2017 08:20 PM    MCV 95.1 02/26/2017 05:32 AM    MCV 95.5 02/23/2017 03:47 AM    MCV 94.9 02/22/2017 08:20 PM    MCH 28.8 02/26/2017 05:32 AM    MCH 29.6 02/23/2017 03:47 AM    MCH 28.8 02/22/2017 08:20 PM    MCHC 30.3 02/26/2017 05:32 AM    MCHC 31.1 02/23/2017 03:47 AM    MCHC 30.4 02/22/2017 08:20 PM    RDW 18.9 (H) 03/19/2009 02:40 AM    RDW 18.0 (H) 03/17/2009 08:05 PM    PLT 129 (L) 02/26/2017 05:32 AM    PLT 148 02/23/2017 03:47 AM    PLT 185 02/22/2017 08:20 PM    Lab Results   Component Value Date/Time    ANEU 7.3 03/19/2009 02:40 AM    ANEU 6.7 03/17/2009 08:05 PM    ABL 0.6 (L) 03/19/2009 02:40 AM    ABL 0.5 (L) 03/17/2009 08:05 PM    ABM 0.6 03/19/2009 02:40 AM    ABM 0.4 03/17/2009 08:05 PM    ABE 0.1 03/19/2009 02:40 AM    ABE 0.0 03/17/2009 08:05 PM    ABB 0.0 03/19/2009 02:40 AM    ABB 0.0 03/17/2009 08:05 PM    GRANS 80.1 (H) 02/26/2017 05:32 AM     GRANS 64.7 (H) 02/23/2017 03:47 AM    GRANS 66.4 (H) 02/22/2017 08:20 PM    LYMPH 12.2 (L) 02/26/2017 05:32 AM    LYMPH 23.6 (L) 02/23/2017 03:47 AM    LYMPH 24.4 (L) 02/22/2017 08:20 PM    MONOS 6.3 02/26/2017 05:32 AM    MONOS 8.8 02/23/2017 03:47 AM    MONOS 7.0 02/22/2017 08:20 PM    EOS 1.0 02/26/2017 05:32 AM    EOS 2.3 02/23/2017 03:47 AM    EOS 1.5 02/22/2017 08:20 PM    BASOS 0.2 02/26/2017 05:32 AM    BASOS 0.4 02/23/2017 03:47 AM    BASOS 0.5 02/22/2017 08:20 PM        Cardiac Enzymes       Lab Results   Component Value Date/Time    INR 1.8 (H) 02/26/2017 05:32 AM    INR 2.5 (H) 02/25/2017 03:04 AM    INR 2.9 (H) 02/24/2017 12:49 AM     Lab Results   Component Value Date/Time    HGB 12.3 (L) 02/26/2017 05:32 AM    HGB 11.8 (L) 02/23/2017 03:47 AM    HGB 13.0 02/22/2017 08:20 PM     Lab Results   Component Value Date/Time    HCT 40.6  02/26/2017 05:32 AM    HCT 38.0 02/23/2017 03:47 AM    HCT 42.8 02/22/2017 08:20 PM       Medications Reviewed:  Current Facility-Administered Medications   Medication Dose Route Frequency   ??? apixaban (ELIQUIS) tablet 5 mg  5 mg Oral Q12H   ??? furosemide (LASIX) injection 40 mg  40 mg IntraVENous BID WITH MEALS   ??? potassium chloride (K-DUR, KLOR-CON) SR tablet 20 mEq  20 mEq Oral BID   ??? febuxostat (ULORIC) tablet 40 mg  40 mg Oral DAILY   ??? gabapentin (NEURONTIN) capsule 200 mg  200 mg Oral TID   ??? levothyroxine (SYNTHROID) tablet 100 mcg  100 mcg Oral 6am   ??? aspirin delayed-release tablet 81 mg  81 mg Oral DAILY   ??? digoxin (LANOXIN) tablet 0.125 mg  0.125 mg Oral EVERY OTHER DAY   ??? omeprazole (PRILOSEC) capsule 20 mg  20 mg Oral DAILY   ??? pentoxifylline CR (TRENTAL) tablet 400 mg  400 mg Oral TID WITH MEALS   ??? sodium chloride (NS) flush 5-10 mL  5-10 mL IntraVENous Q8H         Jezebelle Ledwell C. Dian Situ, MD, FACC, FCCP  [facility], [facility]  Phone:  [phone]    02/27/2017  10:38 AM

## 2017-02-28 LAB — METABOLIC PANEL, BASIC
Anion gap: 2 mmol/L — ABNORMAL LOW (ref 5–15)
BUN: 12 mg/dl (ref 7–25)
CO2: 43 mEq/L — ABNORMAL HIGH (ref 21–32)
Calcium: 8.6 mg/dl (ref 8.5–10.1)
Chloride: 93 mEq/L — ABNORMAL LOW (ref 98–107)
Creatinine: 0.9 mg/dl (ref 0.6–1.3)
GFR est AA: >60
GFR est non-AA: >60
Glucose: 89 mg/dl (ref 74–106)
Potassium: 4.1 mEq/L (ref 3.5–5.1)
Sodium: 138 mEq/L (ref 136–145)

## 2017-02-28 LAB — MAGNESIUM: Magnesium: 2 mg/dl (ref 1.6–2.6)

## 2017-02-28 MED ORDER — FUROSEMIDE 40 MG TAB
40 mg | Freq: Two times a day (BID) | ORAL | Status: DC
Start: 2017-02-28 — End: 2017-03-01
  Administered 2017-02-28 – 2017-03-01 (×2): via ORAL

## 2017-02-28 MED FILL — ULORIC 40 MG TABLET: 40 mg | ORAL | Qty: 1

## 2017-02-28 MED FILL — BD POSIFLUSH NORMAL SALINE 0.9 % INJECTION SYRINGE: INTRAMUSCULAR | Qty: 10

## 2017-02-28 MED FILL — FUROSEMIDE 10 MG/ML IJ SOLN: 10 mg/mL | INTRAMUSCULAR | Qty: 4

## 2017-02-28 MED FILL — ELIQUIS 5 MG TABLET: 5 mg | ORAL | Qty: 1

## 2017-02-28 MED FILL — FUROSEMIDE 40 MG TAB: 40 mg | ORAL | Qty: 1

## 2017-02-28 MED FILL — POTASSIUM CHLORIDE SR 20 MEQ TAB, PARTICLES/CRYSTALS: 20 mEq | ORAL | Qty: 1

## 2017-02-28 MED FILL — PENTOXIFYLLINE SR 400 MG TAB: 400 mg | ORAL | Qty: 1

## 2017-02-28 MED FILL — ASPIRIN 81 MG TAB, DELAYED RELEASE: 81 mg | ORAL | Qty: 1

## 2017-02-28 MED FILL — GABAPENTIN 100 MG CAP: 100 mg | ORAL | Qty: 2

## 2017-02-28 MED FILL — BENZONATATE 100 MG CAP: 100 mg | ORAL | Qty: 1

## 2017-02-28 MED FILL — SYNTHROID 100 MCG TABLET: 100 mcg | ORAL | Qty: 1

## 2017-02-28 MED FILL — OMEPRAZOLE 20 MG CAP, DELAYED RELEASE: 20 mg | ORAL | Qty: 1

## 2017-02-28 NOTE — Progress Notes (Signed)
Patient admitted on 02/22/2017 from [facility] with       Chief Complaint   Patient presents with   ??? Hypotension      ??  The patient has been admitted to the hospital 0 times in the past 12 months.  ??  Updated note CM spoke with liason for [facility] and requested to start authorization.  ??  Tentative dc plan:  Reassessment- SNF with a transition to LTC. Prior to admission patient was at [facility] from Aug-Sept initially lives with sister but CM spoke with patient sister and she will be unable to care for patient fully and prefer for a SNF-preference is [facility] but they have declined spoke with patient sister and patient and informed that [facility] has a SNF and LTC bed available-auth will be needed with [employer].   ??  Facility if plan [facility] pending authorization for SNF if unable to get auth may be LTC at the facility as sister will not be able to care for him   ??  Anticipated Discharge Date:   10/02-10/03  ??  PCP: Ozella Rocks, DO   ??  Specialists:     Cardiology, gastro, pain management-sister was transporting to appointments   ??  Face sheet information, address, contact info and insurance verified  yes  ??  Dialysis Unit/ chair time / access:    No   ??  Pharmacy:     [facility]  Any issues getting medications no  ??  DME at home and provider:    Dan Humphreys and wheelchair  ??  O2 at home  no provider no  ??  Home Environment:    Lives at [address]  [place] [address]    [phone].  ????  Prior to admission open services:     no  ??  Home Health Agency-     no  ??  Personal Care Agency-    no  ??  Extended Emergency Contact Information  Primary Emergency Contact: Renee Rival  Home Phone: [phone]  Relation: Other Relative   ????  Transportation:     CM will setup transport   ??  Therapy Recommendations:  OT :y/n    Discharge Recommendations:??SNF.  Further Equipment Recommendations for Discharge:??if DC'd home - hoyer  lift, wide BSC. ??Patient feels his w/c at home fits.  PT :y/n     Discharge Recommendations:??SNF, Home with home health PT and TBD, pending progress  SLP :y/n    no  ??  RT Home O2 Evaluation :y/n                no  ??  Wound Care: y/n     Yes stage 3 and stage 2 POA  ??  Change consult (formerly [facility]) :    no  ??  Case Management Assessment

## 2017-02-28 NOTE — Progress Notes (Signed)
physical Therapy treatment    Patient: [name] (71 y.o. male)  Room: CD28/CD28    Date: 02/28/2017  Start Time:  1100  End Time:  1135    Primary Diagnosis: Acute hypercapnic respiratory failure (HCC)          Precautions: Falls.  Weight bearing precautions: None      ASSESSMENT :  Based on the objective data described below, the patient presents with  Able to participate with LE exercises in bed with 1 person assist, able to scoot EOB with 2 person assist clearing bottom and part standing  Generalized muscle weakness affecting function   Decreased Strength  Decreased ADL/Functional Activities  Decreased Transfer Abilities  Decreased Ambulation Ability/Technique  Decreased Activity Tolerance.      Patient???s rehabilitation potential is considered to be Good     Recommendations:  Physical Therapy  Discharge Recommendations: SNF, Home with home health PT and TBD, pending progress  Further Equipment Recommendations for Discharge: TBD        PLAN :  Planned Interventions:  Functional mobility training Gait Training Stair training Balance Training Therapeutic exercises Therapeutic activities Neuro muscular re-education AD training Patient/caregiver education      Frequency/Duration: Patient will be followed by physical therapy 3x / Week to address goals.                 Orders reviewed, chart reviewed on [name].    SUBJECTIVE:   Patient: agreed to PT    OBJECTIVE DATA SUMMARY:   Present illness history:   Problem List  Date Reviewed: November 21, 2012          Codes Class Noted    Acute hypercapnic respiratory failure (HCC) ICD-10-CM: J96.02  ICD-9-CM: 518.81  02/22/2017        Hyponatremia ICD-10-CM: E87.1  ICD-9-CM: 276.1  09/03/2010        Hematuria ICD-10-CM: R31.9  ICD-9-CM: 599.70  09/03/2010        CAD (coronary artery disease), native coronary artery ICD-10-CM: I25.10  ICD-9-CM: 414.01  09/03/2010        Defibrillator discharge ICD-10-CM: Z45.02  ICD-9-CM: V71.89  09/03/2010         Cardiomyopathy (HCC) ICD-10-CM: I42.9  ICD-9-CM: 425.4  09/03/2010        Atrial fibrillation (HCC) ICD-10-CM: I48.91  ICD-9-CM: 427.31  09/03/2010        LBBB (left bundle branch block) ICD-10-CM: I44.7  ICD-9-CM: 426.3  09/03/2010        Primary localized osteoarthrosis, hand ICD-10-CM: M19.049  ICD-9-CM: 715.14  09/03/2010        ICD (implantable cardiac defibrillator) in place ICD-10-CM: Z95.810  ICD-9-CM: V45.02  09/03/2010        Pulmonary hypertension (HCC) ICD-10-CM: I27.20  ICD-9-CM: 416.8  09/03/2010        Syncope and collapse ICD-10-CM: R55  ICD-9-CM: 780.2  09/03/2010        Permanent atrial fibrillation (HCC) ICD-10-CM: I48.2  ICD-9-CM: 427.31  09/03/2010        HTN (hypertension), benign ICD-10-CM: I10  ICD-9-CM: 401.1  09/03/2010        Obesity, morbid (HCC) ICD-10-CM: E66.01  ICD-9-CM: 278.01  09/03/2010        Edema ICD-10-CM: R60.9  ICD-9-CM: 782.3  09/03/2010        Varicose veins of lower extremities with ulcer (HCC) ICD-10-CM: I83.009, L97.909  ICD-9-CM: 454.0  09/03/2010        Lymphedema ICD-10-CM: I89.0  ICD-9-CM: 457.1  09/03/2010  Past Medical history:   Past Medical History:   Diagnosis Date   ??? Arthritis    ??? Arthropathy, unspecified, site unspecified    ??? Automatic implantable cardiac defibrillator in situ    ??? Cardiac arrhythmia    ??? CHF (congestive heart failure) (HCC)    ??? Heart disease    ??? Hernia    ??? Irregular heart beat    ??? Kidney stone    ??? Lymphedema    ??? Nerve pain    ??? Pacemaker    ??? Pulmonary hypertension (HCC)    ??? Renal disorder    ??? Renal failure, acute (HCC)    ??? S/P IVC filter    ??? UTI (urinary tract infection)    ??? Varicose veins of lower extremities with ulcer (HCC)    ??? Vision decreased          Patient found: Bed and Oxygen.    Pain Assessment before PT session: None reported  Pain Location:   Pain Assessment after PT session: None reported  Pain Location:    []           Yes, patient had pain medications  []           No, Patient has not had pain medications   []           Nurse notified    COGNITIVE STATUS:     Mental Status: Oriented x3.  Communication: normal.  Follows commands: intact.  General Cognition: intact .  Hearing: grossly intact.  Vision:  grossly intact.            Functional mobility and balance status:     Supine to sit -  min. assist, verbal cues, set-up, safety concerns, increased time and 2 person  Sit to Supine -  mod. assist, verbal cues, manual cues, visual cues, set-up, safety concerns, increased time, 2 person and rail  Sit to Stand -  mod. assist, max. assist, verbal cues, manual cues, visual cues, set-up, safety concerns, increased time, 2 person and pt able to clear bottom but not stand fully, performed mult times to scoot towards HOB, moved approximetly 3 feet  Stand to Sit -  contact guard        Balance:   Static Sitting Balance -  good-  Dynamic Sitting Balance -  good-  Static Standing Balance -  fair  Dynamic Standing Balance -  fair-          Therapeutic Exercises:      Lower Extremities:  Supine, Glut Set, Quad Set, Hamstring Set, Heel Slide, Short arc quad, Straight leg raise, Hip ab/duction, Ankle pumps, BLE, Assisted and 10 reps      Activity Tolerance:   fair    Final Location:   bed, all needs close, agrees to call for assistance and nurse notified     COMMUNICATION/EDUCATION:   Education: Patient, Benefit of activity while hospitalized, Call for assistance, Out of bed 2-3 times/day, Staff assistance with mobility, Changes positions frequently, Sit out of bed for 45-60 minutes or as tolerated, Safety, Functional mobility, Demonstrates adequately, Verbalized understanding, Teaching method, Verbal and Role of PT  Barriers to Learning/Limitations: None    Please refer to care plan and patient education section for further details.    Thank you for this referral.  Darnelle Going, PT, DPT  Pager [phone]

## 2017-02-28 NOTE — Progress Notes (Signed)
PAGER ID: [phone]   MESSAGE: CD28 Manson PasseyBrown, Bernardino(acute hypercapneic respiratory failure) pt had 15 beats vtach. when checked on, he was watching tv and had no compliants. [phone]. Thanks. Archie Pattenonya RN

## 2017-02-28 NOTE — Nurse Consult (Addendum)
Heart Failure Navigator Note  Seen by TCC Navigator for: CHF  HPI:  Admission Day: 5   Diet: cardiac   Labs: Na 138  K+ 3.8  Creatinine 0.7     BNP: 6402   Echo: 22%   CXR:  INDICATIONS: CHF  ??  COMPARISON: Chest radiograph dated 09/13/2011.  ??  IMPRESSION  Impression: Prominent enlargement of the cardiac silhouette. Mild engorgement of  the central central pulmonary vasculature may at most represent mild pulmonary  vascular congestion. No overt congestive change. No large effusions. Pacemaker  is unchanged. Probable calcified lymph nodes, suggesting granulomatous disease.  ??   Net I/O: -4691   Weight: 10/1 137.4 kg     DCP is [facility] SN[facility] and transition to LTC. TCC will not follow.

## 2017-02-28 NOTE — Progress Notes (Signed)
[facility] ([facility]   CARDIOLOGY PROGRESS NOTE    PCP: ??Dr. Lynnell Catalan, DO,   [facility]  [facility] Cardiologist: ??Dr. Rozell Searing  ??  RECS:  continue furosemide diuresis as long as SBP >                        1375cc net NEG last 24hrs                        Continue close f/u of Renal Function, Is/Os, Weights                       Stopped warfarin b/o labile INRs                        Started apixaban  bid   ??  ASSESSMENT:  1. 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)  ??????????- ECHO??01/12/2017:??EF 22% with severely decreased LV function,                         mod-severe basal to mid A/L + I/L wall segments,                         severely dilated LA, dilated RH with normal systolic function,                         severe MR, mod TR and est min RVSP 56 mmHG  2. ??Non-obstructive CAD   3. ??h/o VT   4. ??AF - chronic permanent   Warfarin now changed to apixaban  5. ??Obesity   6. ??Hypotension    SUBJECTIVE:  denies CP or SOB    VS:   Visit Vitals   ??? BP 100/70 (BP 1 Location: Right arm)   ??? Pulse 89   ??? Temp 97.8 ??F (36.6 ??C)   ??? Resp 19   ??? Ht  (1.854 m)   ??? Wt 136.1 kg (300 lb 0.7 oz)   ??? SpO2 95%   ??? BMI 39.59 kg/m2       Intake/Output Summary (Last 24 hours) at 02/28/17 1414  Last data filed at 02/27/17 2334   Gross per 24 hour   Intake              240 ml   Output             1615 ml   Net            -1375 ml     TELE personally reviewed by me:  AF, vvi BiVpacing    EXAM:  General:  Skin warm, perfusion adequate  Neck: JVD is absent  Lungs:  rhonchi   Cardiac:  Regular Rhythm, MR murmur  Abdomen: soft, nl bowel sounds  Ext: Edema is improved  Neuro: Alert and oriented; Nonfocal    Labs:    Basic Metabolic Profile   Lab Results   Component Value Date/Time    NA 138 02/28/2017 04:56 AM    NA 138 02/27/2017 03:23 AM    NA 136 02/26/2017 05:32 AM    K 4.1 02/28/2017 04:56 AM    K 3.8 02/27/2017 03:23 AM     K 3.8 02/26/2017 05:32 AM    CL  93 (L) 02/28/2017 04:56 AM    CL 95 (L) 02/27/2017 03:23 AM    CL 95 (L) 02/26/2017 05:32 AM    CO2 43 (H) 02/28/2017 04:56 AM    CO2 38 (H) 02/27/2017 03:23 AM    CO2 38 (H) 02/26/2017 05:32 AM    BUN 12 02/28/2017 04:56 AM    BUN 11 02/27/2017 03:23 AM    BUN 12 02/26/2017 05:32 AM    CREA 0.9 02/28/2017 04:56 AM    CREA 0.7 02/27/2017 03:23 AM    CREA 0.7 02/26/2017 05:32 AM    GLU 89 02/28/2017 04:56 AM    GLU 77 02/27/2017 03:23 AM    GLU 109 (H) 02/26/2017 05:32 AM    CA 8.6 02/28/2017 04:56 AM    CA 8.6 02/27/2017 03:23 AM    CA 8.2 (L) 02/26/2017 05:32 AM    MG 2.0 02/28/2017 04:56 AM    MG 2.1 02/24/2017 12:49 AM    MG 2.1 03/19/2009 02:40 AM        CBC w/Diff    Lab Results   Component Value Date/Time    WBC 4.9 02/26/2017 05:32 AM    WBC 5.2 02/23/2017 03:47 AM    WBC 5.8 02/22/2017 08:20 PM    RBC 4.27 02/26/2017 05:32 AM    RBC 3.98 02/23/2017 03:47 AM    RBC 4.51 02/22/2017 08:20 PM    HGB 12.3 (L) 02/26/2017 05:32 AM    HGB 11.8 (L) 02/23/2017 03:47 AM    HGB 13.0 02/22/2017 08:20 PM    HCT 40.6 02/26/2017 05:32 AM    HCT 38.0 02/23/2017 03:47 AM    HCT 42.8 02/22/2017 08:20 PM    MCV 95.1 02/26/2017 05:32 AM    MCV 95.5 02/23/2017 03:47 AM    MCV 94.9 02/22/2017 08:20 PM    MCH 28.8 02/26/2017 05:32 AM    MCH 29.6 02/23/2017 03:47 AM    MCH 28.8 02/22/2017 08:20 PM    MCHC 30.3 02/26/2017 05:32 AM    MCHC 31.1 02/23/2017 03:47 AM    MCHC 30.4 02/22/2017 08:20 PM    RDW 18.9 (H) 03/19/2009 02:40 AM    RDW 18.0 (H) 03/17/2009 08:05 PM    PLT 129 (L) 02/26/2017 05:32 AM    PLT 148 02/23/2017 03:47 AM    PLT 185 02/22/2017 08:20 PM    Lab Results   Component Value Date/Time    ANEU 7.3 03/19/2009 02:40 AM    ANEU 6.7 03/17/2009 08:05 PM    ABL 0.6 (L) 03/19/2009 02:40 AM    ABL 0.5 (L) 03/17/2009 08:05 PM    ABM 0.6 03/19/2009 02:40 AM    ABM 0.4 03/17/2009 08:05 PM    ABE 0.1 03/19/2009 02:40 AM    ABE 0.0 03/17/2009 08:05 PM    ABB 0.0 03/19/2009 02:40 AM     ABB 0.0 03/17/2009 08:05 PM    GRANS 80.1 (H) 02/26/2017 05:32 AM    GRANS 64.7 (H) 02/23/2017 03:47 AM    GRANS 66.4 (H) 02/22/2017 08:20 PM    LYMPH 12.2 (L) 02/26/2017 05:32 AM    LYMPH 23.6 (L) 02/23/2017 03:47 AM    LYMPH 24.4 (L) 02/22/2017 08:20 PM    MONOS 6.3 02/26/2017 05:32 AM    MONOS 8.8 02/23/2017 03:47 AM    MONOS 7.0 02/22/2017 08:20 PM    EOS 1.0 02/26/2017 05:32 AM    EOS 2.3 02/23/2017 03:47 AM    EOS 1.5 02/22/2017 08:20 PM  BASOS 0.2 02/26/2017 05:32 AM    BASOS 0.4 02/23/2017 03:47 AM    BASOS 0.5 02/22/2017 08:20 PM        Lab Results   Component Value Date/Time    INR 1.8 (H) 02/26/2017 05:32 AM    INR 2.5 (H) 02/25/2017 03:04 AM    INR 2.9 (H) 02/24/2017 12:49 AM     Lab Results   Component Value Date/Time    HGB 12.3 (L) 02/26/2017 05:32 AM    HGB 11.8 (L) 02/23/2017 03:47 AM    HGB 13.0 02/22/2017 08:20 PM     Lab Results   Component Value Date/Time    HCT 40.6 02/26/2017 05:32 AM    HCT 38.0 02/23/2017 03:47 AM    HCT 42.8 02/22/2017 08:20 PM       Medications Reviewed:  Current Facility-Administered Medications   Medication Dose Route Frequency   ??? furosemide (LASIX) tablet 40 mg  40 mg Oral ACB&D   ??? influenza vaccine 2018-19 (36 mos+)(PF) (FLUZONE QUAD) injection 0.5 mL  0.5 mL IntraMUSCular PRIOR TO DISCHARGE   ??? apixaban (ELIQUIS) tablet 5 mg  5 mg Oral Q12H   ??? potassium chloride (K-DUR, KLOR-CON) SR tablet 20 mEq  20 mEq Oral BID   ??? febuxostat (ULORIC) tablet 40 mg  40 mg Oral DAILY   ??? gabapentin (NEURONTIN) capsule 200 mg  200 mg Oral TID   ??? levothyroxine (SYNTHROID) tablet 100 mcg  100 mcg Oral 6am   ??? aspirin delayed-release tablet 81 mg  81 mg Oral DAILY   ??? digoxin (LANOXIN) tablet 0.125 mg  0.125 mg Oral EVERY OTHER DAY   ??? omeprazole (PRILOSEC) capsule 20 mg  20 mg Oral DAILY   ??? pentoxifylline CR (TRENTAL) tablet 400 mg  400 mg Oral TID WITH MEALS   ??? sodium chloride (NS) flush 5-10 mL  5-10 mL IntraVENous Q8H         Sherley Leser C. Dian Situ, MD, FACC, FCCP   [facility], [facility]  Phone:  [phone]    02/28/2017  2:14 PM

## 2017-02-28 NOTE — Progress Notes (Signed)
NUTRITION follow up    Current diet order: DIET CARDIAC Soft Solids; 2 GM NA (House Low NA); FR 2000ML  DIET NUTRITIONAL SUPPLEMENTS All Meals; Unjury  DIET NUTRITIONAL SUPPLEMENTS All Meals; Ensure Compact    Vitamin/mineral supplementation: none    Current intake: poor-unable to chew regular textured foods, tolerating supplements.     Weight:   Last 3 Recorded Weights in this Encounter    02/26/17 0601 02/27/17 0545 02/28/17 0555   Weight: 141.3 kg (311 lb 8.2 oz) 137.4 kg (302 lb 14.6 oz) 136.1 kg (300 lb 0.7 oz)   5 kg wt loss x 5 days-likely fluid related (per I/O's -5 L LOS)    BMI: Body mass index is 39.59 kg/(m^2).    Physical assessment:  ?? GI symptoms: ventral hernia containing bowel without obstruction. LBM 10/1-watery (x3 LOS-will continue to monitor); active BS  ?? Skin integrity: stage III of sacral-present since 1998 and full thickness wound to posterior thigh  ?? Swallowing issues: missing bottom half of teeth-doesn't wear dentures. Will adjust diet to [place] agreed.  ?? Fluid retention: +2 BLE  ?? Other: UOP 2840 mL x 24 h. Obesity w/ impaired mobility-will be d/c to SNF    Biochemical data: reviewed     Assessment of current MNT: Intake is inadequate to meet nutritional needs    Nutrition diagnosis: Diagnosis re:     1. Increased protein needs related to wound healing as evidenced by stage III sacral and stage II posterior thigh.-continues  ??  2. Decreased sodium and fluid needs related to CHF as evidenced by EF 22%, +2 BLE, need for lasix therapy and 2000 mL fluid restriction.-continues    Nutrition recommendation:   ?? Will adjust diet to [place]  ?? Vitamin/minerals being supplied by supplements    Monitoring and evaluation: PO intake, nutrition-related labs, wt, medical change of condition    Nutrition goals:  PO intake >75%, nutrition-related labs WNL, wt + 2 kg of 136.1 kg over LOS     Progress towards goals: '[]'$  Met/Ongoing   '[X]'$  Progressing-continues '[]'$  Not Progressing     Level of care: moderate    Code status: Full Code    Wallie Renshaw, RD   Pager # [phone]  02/28/17

## 2017-02-28 NOTE — Progress Notes (Signed)
Medicine Progress Note    Date of Admission: 02/22/2017  Length of Stay: 6    Assessment And Plan     1. Hypotension: Sent to hospital from cardiology office due to hypotension..  Holding lisinopril, Entresto, and metolazone PRN, but sounds like medications were adjusted after [facility].    Blood pressure still fluctuating.  Discontinue IV Lasix.  Start oral Lasix.  If blood pressure remains low I will restart midodrine  ??  2. 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  Echo [facility] system from 12/2016 reports EF of 22% (decreased from prior), G III DD, Severely dilated LA, Dilated RV with normal function, Mod PHTN with RVSP of 56%, and Mod pericardial effusion. Intake CXR reports mild pulm vascular congestion.  Clinically better.  DC IV Lasix.  Start oral Lasix  ??  3. Chronic hypercapnic and hypoxic respiratory failure:   No acute respiratory symptoms reported. He reports he has intermittently been on O2 at rehab. Currently on 1.5 L now-RT consulted to wean as tolerated.  Nebs prn.  ??  4. Hypothyroidism: Stable.  Continue levothyroxine  ??  5. Peripheral arterial disease: Reports leg weakness as the reason why he was in rehab. Continue ASA, Pentoxifylline.  Chronic stasis changes noted to both legs.  PT has evaluated and recommends to continue SNF.  ??  6. Chronic atrial fibrillation, s/p ICD: Continue BB as tolerated.  Continue digoxin and Eliquis.  ??  7. Pulmonary hypertension severe per cath in 2010: With Echo findings as above. Meds as above.  ??  8.  Extremity edema due to CHF.  Change Lasix to p.o.  ??  9. 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 Attempt to find balance between heart failure medications and hypotesnion.  ??  10. Hypokalemia: Improved with replacement.  3.8 now  ??  11. Hx of Gout: Resume home ulroic, gabapentin. Norco available at 5mg  prn.  ??  12. Longterm Anticoagulation: Continue clear liquids   ??  13. Ventral Hernia containing Bowel: Patient reports had been hospitalized with what sounds like concerns for bowel obstruction, He [facility] as [facility]  CT from [facility] from Earlier this month reports ventral hernia containing bowel without evidence of obstruction.   No abdominal pain currently.  ??  14. GERD: Continue home PPI.  ??  15. 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  Notes Obesity with impaired mobility  ??  16. Dysuria: Currently better.   culture mixed flora.  Likely contamination.    Received call from patient insurance.  They do not approve skilled nursing at this time.  Recommended long-term care  ??  PPX: On Eliquis            Discussed with patient. Updates regarding diagnose, tests results, prognosis, treatment  of the patient's medical conditions discussed in detail     Subjective:     Blood pressure frustrating.  No dizziness or shortness of breath.  Just feeling weak..     Objective:     Patient Vitals for the past 12 hrs:   Temp Pulse Resp BP SpO2   02/28/17 1117 - - - 100/70 -   02/28/17 1115 - - - 90/70 -   02/28/17 1109 97.8 ??F (36.6 ??C)  89 19 (!) 85/58 95 %   02/28/17 0730 97.4 ??F (36.3 ??C) 83 18 103/62 94 %   02/28/17 0555 98.1 ??F (36.7 ??C) 85 18 91/59 94 %         Intake/Output Summary (Last 24 hours) at 02/28/17 1232  Last data filed at 02/27/17 2334   Gross per 24 hour   Intake              480 ml   Output             1615 ml   Net            -1135 ml     Constitutional:??No acute distress  HENT:??atraumatic, normocephalic, oropharynx clear ??  Eyes:??????conjunctiva normal  Neck:??????supple and trachea normal  Cardiovascular:?? Regular rate and rhythm, heart sounds normal, intact distal pulses  Pulmonary/Chest Wall: Crackles lung bases  Abdominal:????????????appearance normal, soft, non-tender upon palpation,bowel  sounds normal.  Neurological:??????awake, alert and oriented, CN intact, severe generalized weakness worse in lower extremity.  Extremities:??Dark skin on lower extremity.  1+ edema      Recent Results (from the past 24 hour(s))   METABOLIC PANEL, BASIC    Collection Time: 02/28/17  4:56 AM   Result Value Ref Range    Sodium 138 136 - 145 mEq/L    Potassium 4.1 3.5 - 5.1 mEq/L    Chloride 93 (L) 98 - 107 mEq/L    CO2 43 (H) 21 - 32 mEq/L    Glucose 89 74 - 106 mg/dl    BUN 12 7 - 25 mg/dl    Creatinine 0.9 0.6 - 1.3 mg/dl    GFR est AA >16      GFR est non-AA >60      Calcium 8.6 8.5 - 10.1 mg/dl    Anion gap 2 (L) 5 - 15 mmol/L   MAGNESIUM    Collection Time: 02/28/17  4:56 AM   Result Value Ref Range    Magnesium 2.0 1.6 - 2.6 mg/dl       All Micro Results     Procedure Component Value Units Date/Time    CULTURE, URINE [[id number]]  (Abnormal) Collected:  02/25/17 1835    Order Status:  Completed Specimen:  Urine from Urine Updated:  02/26/17 1954     Culture result -- (A)        20,000 CFU/mL  Mixed Flora, three or more different organisms present  No Further Workup      CULTURE, URINE [[id number]]     Order Status:  Canceled Specimen:  Urine from Clean catch     MRSA SCREEN - PCR (NASAL) [[id number]]     Order Status:  Canceled Specimen:  Nasal from Nares           Imaging:    Xr Chest Sngl V    Result Date: 02/25/2017  Indication: Congestive heart failure.     IMPRESSION: Portable AP upright view the chest exposed at 5:37 PM February 25, 2017 reveals the heart is enlarged. The pattern is consistent with a cardiomyopathy or pericardial effusion. There is a small right pleural effusion with atelectasis in the underlying lung. The left lung is clear. The pulmonary vasculature is within normal limits. Transvenous pacemaker is again seen. There are calcified apical pulmonary and mediastinal lymph nodes. No change from February 22, 2017     Xr Chest Pa Lat    Result Date: 02/22/2017   EXAM: Frontal and lateral views of the chest. INDICATIONS: CHF  COMPARISON: Chest radiograph dated 09/13/2011.     Impression: Prominent enlargement of the cardiac silhouette. Mild engorgement of the central central pulmonary vasculature may at most represent mild pulmonary vascular congestion. No overt congestive change. No large effusions. Pacemaker is unchanged. Probable calcified lymph nodes, suggesting granulomatous disease.           Echo Results  (Last 48 hours)    None          Current Facility-Administered Medications   Medication Dose Route Frequency   ??? influenza vaccine 2018-19 (36 mos+)(PF) (FLUZONE QUAD) injection 0.5 mL  0.5 mL IntraMUSCular PRIOR TO DISCHARGE   ??? apixaban (ELIQUIS) tablet 5 mg  5 mg Oral Q12H   ??? guaiFENesin-dextromethorphan (ROBITUSSIN DM) 100-10 mg/5 mL syrup 5 mL  5 mL Oral Q6H PRN   ??? benzonatate (TESSALON) capsule 100 mg  100 mg Oral TID PRN   ??? furosemide (LASIX) injection 40 mg  40 mg IntraVENous BID WITH MEALS   ??? albuterol-ipratropium (DUO-NEB) 2.5 MG-0.5 MG/3 ML  3 mL Nebulization Q4H PRN   ??? polyethylene glycol (MIRALAX) packet 17 g  17 g Oral DAILY PRN   ??? potassium chloride (K-DUR, KLOR-CON) SR tablet 20 mEq  20 mEq Oral BID   ??? febuxostat (ULORIC) tablet 40 mg  40 mg Oral DAILY   ??? gabapentin (NEURONTIN) capsule 200 mg  200 mg Oral TID   ??? levothyroxine (SYNTHROID) tablet 100 mcg  100 mcg Oral 6am   ??? aspirin delayed-release tablet 81 mg  81 mg Oral DAILY   ??? digoxin (LANOXIN) tablet 0.125 mg  0.125 mg Oral EVERY OTHER DAY   ??? omeprazole (PRILOSEC) capsule 20 mg  20 mg Oral DAILY   ??? pentoxifylline CR (TRENTAL) tablet 400 mg  400 mg Oral TID WITH MEALS   ??? sodium chloride (NS) flush 5-10 mL  5-10 mL IntraVENous Q8H   ??? sodium chloride (NS) flush 5-10 mL  5-10 mL IntraVENous PRN   ??? naloxone (NARCAN) injection 0.1 mg  0.1 mg IntraVENous PRN   ??? acetaminophen (TYLENOL) tablet 650 mg  650 mg Oral Q6H PRN    ??? HYDROcodone-acetaminophen (NORCO) 5-325 mg per tablet 1 Tab  1 Tab Oral Q4H PRN   ??? morphine injection 1 mg  1 mg IntraVENous Q4H PRN   ??? alum-mag hydroxide-simeth (MYLANTA) oral suspension 30 mL  30 mL Oral Q4H PRN   ??? albuterol (PROVENTIL VENTOLIN) nebulizer solution 2.5 mg  2.5 mg Nebulization Q6H PRN            Dragon medical dictation software was used for portions of this report. Unintended errors may occur.   Lawana Chambers, MD  02/28/2017 11:46 AM

## 2017-03-01 LAB — METABOLIC PANEL, BASIC
Anion gap: 6 mmol/L (ref 5–15)
BUN: 12 mg/dl (ref 7–25)
CO2: 39 mEq/L — ABNORMAL HIGH (ref 21–32)
Calcium: 8.8 mg/dl (ref 8.5–10.1)
Chloride: 93 mEq/L — ABNORMAL LOW (ref 98–107)
Creatinine: 0.8 mg/dl (ref 0.6–1.3)
GFR est AA: >60
GFR est non-AA: >60
Glucose: 99 mg/dl (ref 74–106)
Potassium: 3.7 mEq/L (ref 3.5–5.1)
Sodium: 137 mEq/L (ref 136–145)

## 2017-03-01 MED ORDER — APIXABAN 5 MG TABLET
5 mg | ORAL_TABLET | Freq: Two times a day (BID) | ORAL | 3 refills | Status: AC
Start: 2017-03-01 — End: ?

## 2017-03-01 MED ORDER — ACETAMINOPHEN 325 MG TABLET
325 mg | ORAL_TABLET | Freq: Four times a day (QID) | ORAL | 0 refills | Status: AC | PRN
Start: 2017-03-01 — End: ?

## 2017-03-01 MED ORDER — MIDODRINE 5 MG TAB
5 mg | ORAL_TABLET | Freq: Three times a day (TID) | ORAL | 0 refills | Status: AC
Start: 2017-03-01 — End: ?

## 2017-03-01 MED ORDER — BENZONATATE 100 MG CAP
100 mg | ORAL_CAPSULE | Freq: Three times a day (TID) | ORAL | 0 refills | Status: AC | PRN
Start: 2017-03-01 — End: 2017-03-08

## 2017-03-01 MED ORDER — POTASSIUM CHLORIDE SR 20 MEQ TAB, PARTICLES/CRYSTALS
20 mEq | ORAL_TABLET | Freq: Two times a day (BID) | ORAL | 0 refills | Status: AC
Start: 2017-03-01 — End: ?

## 2017-03-01 MED ORDER — POLYETHYLENE GLYCOL 3350 17 GRAM (100 %) ORAL POWDER PACKET
17 gram | PACK | Freq: Two times a day (BID) | ORAL | 0 refills | Status: AC | PRN
Start: 2017-03-01 — End: ?

## 2017-03-01 MED ORDER — IPRATROPIUM-ALBUTEROL 2.5 MG-0.5 MG/3 ML NEB SOLUTION
2.5 mg-0.5 mg/3 ml | INHALATION_SOLUTION | RESPIRATORY_TRACT | 0 refills | Status: AC | PRN
Start: 2017-03-01 — End: ?

## 2017-03-01 MED FILL — ELIQUIS 5 MG TABLET: 5 mg | ORAL | Qty: 1

## 2017-03-01 MED FILL — BENZONATATE 100 MG CAP: 100 mg | ORAL | Qty: 1

## 2017-03-01 MED FILL — ASPIRIN 81 MG TAB, DELAYED RELEASE: 81 mg | ORAL | Qty: 1

## 2017-03-01 MED FILL — PENTOXIFYLLINE SR 400 MG TAB: 400 mg | ORAL | Qty: 1

## 2017-03-01 MED FILL — BD POSIFLUSH NORMAL SALINE 0.9 % INJECTION SYRINGE: INTRAMUSCULAR | Qty: 10

## 2017-03-01 MED FILL — ULORIC 40 MG TABLET: 40 mg | ORAL | Qty: 1

## 2017-03-01 MED FILL — GABAPENTIN 100 MG CAP: 100 mg | ORAL | Qty: 2

## 2017-03-01 MED FILL — POTASSIUM CHLORIDE SR 20 MEQ TAB, PARTICLES/CRYSTALS: 20 mEq | ORAL | Qty: 1

## 2017-03-01 MED FILL — SYNTHROID 100 MCG TABLET: 100 mcg | ORAL | Qty: 1

## 2017-03-01 MED FILL — DIGOXIN 0.125 MG TAB: 0.125 mg | ORAL | Qty: 1

## 2017-03-01 MED FILL — FLUZONE QUAD 2018-2019 (PF) 60 MCG (15 MCG X 4)/0.5 ML IM SUSPENSION: 60 mcg (15 mcg x 4)/0.5 mL | INTRAMUSCULAR | Qty: 0.5

## 2017-03-01 MED FILL — OMEPRAZOLE 20 MG CAP, DELAYED RELEASE: 20 mg | ORAL | Qty: 1

## 2017-03-01 MED FILL — ACETAMINOPHEN 325 MG TABLET: 325 mg | ORAL | Qty: 2

## 2017-03-01 MED FILL — FUROSEMIDE 40 MG TAB: 40 mg | ORAL | Qty: 1

## 2017-03-01 NOTE — Progress Notes (Signed)
CM received voicemail from [facility] did not approve for SNF-patient is at his baseline patient to transition to LTC facility upon discharge-tentative plan is for [facility] [place] sw consult for uai/96.

## 2017-03-01 NOTE — Progress Notes (Signed)
[facility] to : [facility]  transport set up with : [facility]  Time/date   03/01/2017 @ 1330  Dc summary loaded   Yes-CM to place when completed by MD  nurse/cm notified   yes  envelope delivered   yes  Insurance verified on face sheet yes  auth needed    yes  auth # [id number]    CM aware D/C Summary needed

## 2017-03-01 NOTE — Progress Notes (Signed)
This SW received a referral from Norm Salt CM for a UAI/96 for LTC.  Completed paperwork and sent to state for approval.

## 2017-03-01 NOTE — Discharge Summary (Signed)
DISCHARGE SUMMARY     Patient Name: [name]  Medical Record Number: [id number]  Date of Birth: 1946/03/12  Discharge Provider: Lawana Chambers, MD  Primary Care Provider: Ozella Rocks, DO    Admit date: 02/22/2017  Discharge Date:  03/16/2017 Time: 12:10 PM  Discharge Disposition: Long Term Care Facility  Code Status: Full Code    Follow-up appointments:     Follow-up Information     Follow up With Details Comments Contact Info    Ozella Rocks, DO In 2 weeks follow up appointment on March 17, 2017 @ 9:00 AM [address]  [place] [address]  [phone]      [facility]  LTC [address]  [place] [address]  [phone]    Michaela Corner, MD In 2 weeks (Cardiologist) Dr. Linnell Fulling office will call you for your appointment with them [address]  [place] [address]  [phone]             Follow-up recommendations:   Follow-up with PCP and cardiology              Repeat BMP in 1 week  Discharge diagnosis:  1. Primary diagnosis           Acute on chronic combined systolic and diastolic congestive heart failure [facility])                    Hospital Problems as of 03-16-17  Date Reviewed: March 16, 2017          Codes Class Noted - Resolved POA    * (Principal)Acute on chronic combined systolic and diastolic congestive heart failure (HCC) ICD-10-CM: I50.43  ICD-9-CM: 428.43, 428.0  02/22/2017 - Present Yes                                            Past Medical History:   Diagnosis Date   ??? Arthritis    ??? Arthropathy, unspecified, site unspecified    ??? Automatic implantable cardiac defibrillator in situ    ??? Cardiac arrhythmia    ??? CHF (congestive heart failure) (HCC)    ??? Heart disease    ??? Hernia    ??? Irregular heart beat    ??? Kidney stone    ??? Lymphedema    ??? Nerve pain    ??? Pacemaker    ??? Pulmonary hypertension (HCC)    ??? Renal disorder    ??? Renal failure, acute (HCC)    ??? S/P IVC filter    ??? UTI (urinary tract infection)     ??? Varicose veins of lower extremities with ulcer (HCC)    ??? Vision decreased                                                         Discharge Medications:                 Current Discharge Medication List      START taking these medications    Details   acetaminophen (TYLENOL) 325 mg tablet Take 2 Tabs by mouth every six (6) hours as needed.  Qty: 60 Tab, Refills: 0  albuterol-ipratropium (DUO-NEB) 2.5 mg-0.5 mg/3 ml nebu 3 mL by Nebulization route every four (4) hours as needed.  Qty: 30 Nebule, Refills: 0      apixaban (ELIQUIS) 5 mg tablet Take 1 Tab by mouth every twelve (12) hours.  Qty: 60 Tab, Refills: 3      benzonatate (TESSALON) 100 mg capsule Take 1 Cap by mouth three (3) times daily as needed for Cough for up to 7 days.  Qty: 30 Cap, Refills: 0      polyethylene glycol (MIRALAX) 17 gram packet Take 1 Packet by mouth two (2) times daily as needed.  Qty: 14 Packet, Refills: 0      potassium chloride (K-DUR, KLOR-CON) 20 mEq tablet Take 1 Tab by mouth two (2) times a day.  Qty: 30 Tab, Refills: 0         CONTINUE these medications which have CHANGED    Details   midodrine (PROAMITINE) 5 mg tablet Take 1 Tab by mouth three (3) times daily. Hold for SBP >130  Qty: 30 Tab, Refills: 0         CONTINUE these medications which have NOT CHANGED    Details   febuxostat (ULORIC) 40 mg tab tablet Take 40 mg by mouth daily.      lubiPROStone (AMITIZA) 24 mcg capsule Take 24 mcg by mouth two (2) times daily (with meals). Indications: chronic idiopathic constipation      bisacodyl (DULCOLAX) 10 mg suppository Insert 10 mg into rectum daily as needed (constipation).      DIGOXIN PO Take 125 mcg by mouth every other day. Hold for apical Pulse <60bpm   Indications: Heart Failure      levothyroxine (SYNTHROID) 100 mcg tablet Take 100 mcg by mouth Daily (before breakfast).      cholecalciferol (VITAMIN D3) 1,000 unit tablet Take 1,000 Units by mouth daily.       ondansetron hcl (ZOFRAN) 4 mg tablet Take 4 mg by mouth every eight (8) hours as needed for Nausea.      gabapentin (NEURONTIN) 300 mg capsule Take 100 mg by mouth three (3) times daily.      aspirin delayed-release 81 mg tablet Take  by mouth daily.      furosemide (LASIX) 40 mg tablet Take 40 mg by mouth two (2) times a day.      pentoxifylline CR (TRENTAL) 400 mg CR tablet Take 400 mg by mouth three (3) times daily (with meals).        nitroglycerin (NITROSTAT) 0.4 mg SL tablet by SubLINGual route every five (5) minutes as needed.        MULTIVITAMIN/FA/ZINC ASCORBATE (SOURCECF MULTIVIT WITH ZINC PO) Take  by mouth.        omeprazole (PRILOSEC) 20 mg capsule Take 20 mg by mouth two (2) times a day.         STOP taking these medications       warfarin (COUMADIN) 3 mg tablet Comments:   Reason for Stopping:         warfarin (COUMADIN) 4 mg tablet Comments:   Reason for Stopping:         metOLazone (ZAROXOLYN) 2.5 mg tablet Comments:   Reason for Stopping:         colchicine (COLCRYS) 0.6 mg tablet Comments:   Reason for Stopping:         sacubitril-valsartan (ENTRESTO) 24 mg/26 mg tablet Comments:   Reason for Stopping:         HYDROcodone-acetaminophen (NORCO)  10-325 mg tablet Comments:   Reason for Stopping:         carvedilol (COREG) 6.25 mg tablet Comments:   Reason for Stopping:         lisinopril (PRINIVIL) 5 mg tablet Comments:   Reason for Stopping:               Discharge Diet:            Diet: Cardiac Diet    Consultants: Cardiology    Procedures:   * No surgery found *        Most Recent BMP and CBC:    Recent Labs      03/01/17   0622   BUN  12   NA  137   CO2  39*     No results for input(s): WBC, RBC, HCT, MCV, MCH, MCHC, RDW, HCTEXT in the last 72 hours.    No lab exists for component: HEMOGLOBIN, PLATELET    Imaging:    Xr Chest Sngl V    Result Date: 02/25/2017  Indication: Congestive heart failure.     IMPRESSION: Portable AP upright view the chest exposed at 5:37 PM  February 25, 2017 reveals the heart is enlarged. The pattern is consistent with a cardiomyopathy or pericardial effusion. There is a small right pleural effusion with atelectasis in the underlying lung. The left lung is clear. The pulmonary vasculature is within normal limits. Transvenous pacemaker is again seen. There are calcified apical pulmonary and mediastinal lymph nodes. No change from February 22, 2017     Xr Chest Pa Lat    Result Date: 02/22/2017  EXAM: Frontal and lateral views of the chest. INDICATIONS: CHF COMPARISON: Chest radiograph dated 09/13/2011.     Impression: Prominent enlargement of the cardiac silhouette. Mild engorgement of the central central pulmonary vasculature may at most represent mild pulmonary vascular congestion. No overt congestive change. No large effusions. Pacemaker is unchanged. Probable calcified lymph nodes, suggesting granulomatous disease.       Echo Results  (Last 48 hours)    None          History of presenting illness:( Per admitting M.D.)  Praise is a 71 y.o. WHITE OR CAUCASIAN male who presents after being sent from cardiology office for profound hypotension  Patient was found to be hypotensive despite holding medications at rehab and being on midodrine  Has dilated cardiomyopathy with EF of 22% and is failed to improve despite cardiac resynchronization therapy  Patient was asked to go to the emergency room and possibly stop Entresto and possibly Coreg  He has been having bilateral lower extremity weakness, dizziness and lightheadedness  Because of his hypotension he has not been able to take his prescribed medications and was started on midodrine to improve his blood pressure  He was recently started on Entresto, Coreg and Lasix in divided doses in addition to midodrine and pain medication for his chronic pain  He was admitted to [facility] and was transferred to rehab following that    Hospital course:    Patient was sent to hospital from cardiology office due to hypotension and CHF.  Cardiology consulted    1. Hypotension likely due to hypovolemic shock.  POA: Sent to hospital from cardiology office due to hypotension.Marland Kitchen??Cardiology recommended to hold his lisinopril, Entresto, and metolazone . recent Ef of 22% at [facility]. ??    Blood pressure currently stable.  We will continue to hold above medications.  Patient will follow up  with cardiology as outpatient  ????  2. 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 ??Echo in [facility] system from 12/2016 reports EF of 22% (decreased from prior), G III DD, Severely dilated LA, Dilated RV with normal function, Mod PHTN with RVSP of 56%, and Mod pericardial effusion. Intake CXR reports mild pulm vascular congestion.  Patient received IV Lasix.  Currently better.  ????  3. Chronic hypercapnic and hypoxic respiratory failure:  ??No acute respiratory symptoms reported. He reports he has intermittently been on O2 at rehab.  Continue oxygen  ????  4. Hypothyroidism: Stable.  Continue levothyroxine  ????  5. Peripheral arterial disease: Reports leg weakness as the reason why he was in rehab. Continue ASA, Pentoxifylline. ??Chronic stasis changes noted to both legs.  Patient being discharged back to long-term care with physical therapy  ????  6. Chronic atrial fibrillation, s/p ICD:   Continue digoxin and Eliquis.  ????  7. Pulmonary hypertension severe per cath in 2010: With Echo findings as above.  ????  8.  Extremity edema due to CHF.    Continue p.o. Lasix  ????  9. 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  Follow-up with cardiology as outpatient  ????  10. Hypokalemia: Improved with replacement.  3.8 now  ????  11. Hx of Gout: Resume home ulroic, gabapentin. Norco available at  prn.  ????  12. Longterm??Anticoagulation: Continue clear liquids  ????   13. Ventral Hernia containing Bowel: Stable.  Will monitor.   No abdominal pain currently.  ????  14. GERD: Continue home PPI.  ????  15. 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 ??Continue wound care  ????  16. Dysuria: Currently better.   culture mixed flora.  Likely contamination.    Also try to call patient's sister on phone #[phone] to update on discharge planning but she is unavailable  Vitals on Discharge:  Visit Vitals   ??? BP 103/67 (BP 1 Location: Right arm, BP Patient Position: Supine)   ??? Pulse 81   ??? Temp 97.4 ??F (36.3 ??C)   ??? Resp 18   ??? Ht  (1.854 m)   ??? Wt 136.1 kg (300 lb 0.7 oz)   ??? SpO2 97%   ??? BMI 39.59 kg/m2       Physical Exam:  Constitutional:??Awake and alert, NAD  HENT:??atraumatic, normocephalic, oropharynx clear ??  Eyes:??????conjunctiva normal  Neck:??????supple and trachea normal  Cardiovascular:?? Regular rate and rhythm, heart sounds normal, intact distal pulses  Pulmonary/Chest Wall: Basal crackles  Abdominal:????????????appearance normal, soft, non-tender upon palpation,bowel sounds normal.  Neurological:??????awake, alert and oriented, CN intact, severe generalized weakness  Extremities:???? 1+ edema     Discharge condition:  stable    Discharge activity and restrictions: PT/OT Eval and Treat    Time spent with discharging patient:   Discharge plan discussed with pt. All questions answered. Need for compliance with medicine and follow up emphasized. Pt verbalized the understanding of care. Time spent 35 minutes.      Lawana Chambers, MD  03/01/2017  12:10 PM    Copies: Ozella Rocks, DO

## 2017-03-01 NOTE — Progress Notes (Signed)
Discharge Plan:   [facility] for LTC-[employer] did not approve for SNF informed patient is at his baseline. CM informed patient sister and she is aware. CM to setup transport see dcpa note for time. Nurse can call report to [phone] going to [address].    Discharge Date:     03/01/2017     Assisted Living Facility:    no    FOC given :     The facility confirmed that they are ready to receive the patient:     Yes/No; the CM spoke with     April Wiggins    Does the patient have  an insurance company assigned cm   Yes/NO    Spoke with case Production designer, theatre/television/filmmanager  no    Collaborative dc plan  no    Is patient going to ltc? yes name  [facility]    Wound vac needed at time of dc to snf  no    BIPAP needed at snf  no    Any other equipment needs at snf? no    Home Health Needed:     no    Home Health Agency:    no      Confirmed start of care with the home health agency and spoke with:      no    DME needed and ordered for Discharge:  no bipap/cpap no wound vac no    DME Company:    no    CM confirmed delivery of DME: with      no    TCC Referral:     no    Medication Assistance given     Y/N       meds given (please list)     no    Change(MDC/SSDI) Referral/outcome    no     Transportation: Development worker, communityamily/ Medical    Medical    Transport Time:    Pending time see dcpa note    Family, MD, patient, nurse and facility aware of pickup time    Yes patient and sister

## 2017-03-01 NOTE — Other (Signed)
Report given to whitney at [facility]

## 2017-11-27 DEATH — deceased

## 2017-12-27 ENCOUNTER — Encounter: Attending: Urology | Primary: Family Medicine

## 2019-01-29 DEATH — deceased

## 2022-02-03 ENCOUNTER — Telehealth: Payer: Self-pay | Admitting: Internal Medicine

## 2022-02-03 NOTE — Telephone Encounter (Signed)
error
